# Patient Record
Sex: Female | Born: 1935
Health system: Southern US, Community
[De-identification: ages and names within clinical notes are randomized; demographics above are authoritative.]

## PROBLEM LIST (undated history)

## (undated) DIAGNOSIS — Z803 Family history of malignant neoplasm of breast: Secondary | ICD-10-CM

## (undated) DIAGNOSIS — H9190 Unspecified hearing loss, unspecified ear: Secondary | ICD-10-CM

## (undated) DIAGNOSIS — Z8371 Family history of colonic polyps: Secondary | ICD-10-CM

## (undated) DIAGNOSIS — I10 Essential (primary) hypertension: Secondary | ICD-10-CM

## (undated) DIAGNOSIS — J189 Pneumonia, unspecified organism: Secondary | ICD-10-CM

## (undated) DIAGNOSIS — G039 Meningitis, unspecified: Secondary | ICD-10-CM

## (undated) DIAGNOSIS — I340 Nonrheumatic mitral (valve) insufficiency: Secondary | ICD-10-CM

## (undated) DIAGNOSIS — I071 Rheumatic tricuspid insufficiency: Secondary | ICD-10-CM

## (undated) DIAGNOSIS — R51 Headache: Secondary | ICD-10-CM

## (undated) DIAGNOSIS — Z83719 Family history of colon polyps, unspecified: Secondary | ICD-10-CM

## (undated) DIAGNOSIS — R0600 Dyspnea, unspecified: Secondary | ICD-10-CM

## (undated) DIAGNOSIS — I272 Pulmonary hypertension, unspecified: Secondary | ICD-10-CM

## (undated) DIAGNOSIS — E119 Type 2 diabetes mellitus without complications: Secondary | ICD-10-CM

## (undated) DIAGNOSIS — K219 Gastro-esophageal reflux disease without esophagitis: Secondary | ICD-10-CM

## (undated) DIAGNOSIS — M810 Age-related osteoporosis without current pathological fracture: Secondary | ICD-10-CM

## (undated) DIAGNOSIS — R519 Headache, unspecified: Secondary | ICD-10-CM

## (undated) DIAGNOSIS — Z8041 Family history of malignant neoplasm of ovary: Secondary | ICD-10-CM

## (undated) DIAGNOSIS — E785 Hyperlipidemia, unspecified: Secondary | ICD-10-CM

## (undated) DIAGNOSIS — I371 Nonrheumatic pulmonary valve insufficiency: Secondary | ICD-10-CM

## (undated) HISTORY — PX: ABDOMINAL HYSTERECTOMY: SHX81

## (undated) HISTORY — DX: Type 2 diabetes mellitus without complications: E11.9

## (undated) HISTORY — DX: Rheumatic tricuspid insufficiency: I07.1

## (undated) HISTORY — DX: Family history of malignant neoplasm of breast: Z80.3

## (undated) HISTORY — DX: Family history of colon polyps, unspecified: Z83.719

## (undated) HISTORY — DX: Meningitis, unspecified: G03.9

## (undated) HISTORY — DX: Pulmonary hypertension, unspecified: I27.20

## (undated) HISTORY — DX: Nonrheumatic mitral (valve) insufficiency: I34.0

## (undated) HISTORY — DX: Age-related osteoporosis without current pathological fracture: M81.0

## (undated) HISTORY — DX: Family history of malignant neoplasm of ovary: Z80.41

## (undated) HISTORY — DX: Family history of colonic polyps: Z83.71

## (undated) HISTORY — DX: Nonrheumatic pulmonary valve insufficiency: I37.1

## (undated) HISTORY — DX: Hyperlipidemia, unspecified: E78.5

## (undated) HISTORY — DX: Essential (primary) hypertension: I10

---
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
## 1999-01-05 ENCOUNTER — Encounter: Payer: Self-pay | Admitting: Urology

## 1999-01-17 ENCOUNTER — Observation Stay (HOSPITAL_COMMUNITY): Admission: RE | Admit: 1999-01-17 | Discharge: 1999-01-18 | Payer: Self-pay | Admitting: Urology

## 1999-08-29 ENCOUNTER — Other Ambulatory Visit: Admission: RE | Admit: 1999-08-29 | Discharge: 1999-08-29 | Payer: Self-pay | Admitting: Obstetrics and Gynecology

## 2000-07-23 ENCOUNTER — Other Ambulatory Visit: Admission: RE | Admit: 2000-07-23 | Discharge: 2000-07-23 | Payer: Self-pay | Admitting: Orthopedic Surgery

## 2000-08-29 ENCOUNTER — Other Ambulatory Visit: Admission: RE | Admit: 2000-08-29 | Discharge: 2000-08-29 | Payer: Self-pay | Admitting: Obstetrics and Gynecology

## 2001-04-23 ENCOUNTER — Ambulatory Visit (HOSPITAL_COMMUNITY): Admission: RE | Admit: 2001-04-23 | Discharge: 2001-04-23 | Payer: Self-pay | Admitting: *Deleted

## 2001-09-15 ENCOUNTER — Other Ambulatory Visit: Admission: RE | Admit: 2001-09-15 | Discharge: 2001-09-15 | Payer: Self-pay | Admitting: Obstetrics and Gynecology

## 2002-09-21 ENCOUNTER — Other Ambulatory Visit: Admission: RE | Admit: 2002-09-21 | Discharge: 2002-09-21 | Payer: Self-pay | Admitting: Obstetrics and Gynecology

## 2005-02-22 ENCOUNTER — Ambulatory Visit (HOSPITAL_COMMUNITY): Admission: RE | Admit: 2005-02-22 | Discharge: 2005-02-22 | Payer: Self-pay | Admitting: Orthopedic Surgery

## 2007-09-22 ENCOUNTER — Encounter: Admission: RE | Admit: 2007-09-22 | Discharge: 2007-09-22 | Payer: Self-pay | Admitting: Family Medicine

## 2010-06-23 ENCOUNTER — Other Ambulatory Visit: Payer: Self-pay | Admitting: Family Medicine

## 2010-06-26 ENCOUNTER — Ambulatory Visit
Admission: RE | Admit: 2010-06-26 | Discharge: 2010-06-26 | Disposition: A | Discharge status: Home / Self Care | Payer: Medicare Other | Source: Ambulatory Visit | Attending: Family Medicine | Admitting: Family Medicine

## 2010-06-30 ENCOUNTER — Other Ambulatory Visit: Payer: Self-pay | Admitting: Family Medicine

## 2010-06-30 DIAGNOSIS — N281 Cyst of kidney, acquired: Secondary | ICD-10-CM

## 2010-07-05 ENCOUNTER — Ambulatory Visit
Admission: RE | Admit: 2010-07-05 | Discharge: 2010-07-05 | Disposition: A | Discharge status: Home / Self Care | Payer: Medicare Other | Source: Ambulatory Visit | Attending: Family Medicine | Admitting: Family Medicine

## 2010-07-05 DIAGNOSIS — N281 Cyst of kidney, acquired: Secondary | ICD-10-CM

## 2010-07-05 MED ORDER — IOHEXOL 300 MG/ML  SOLN
100.0000 mL | Freq: Once | INTRAMUSCULAR | Status: AC | PRN
  Administered 2010-07-05: 100 mL via INTRAVENOUS

## 2010-08-25 NOTE — Op Note (Signed)
NAME:  Maria Collier, Maria Collier               ACCOUNT NO.:  [id number]   MEDICAL RECORD NO.:  [id number]          PATIENT TYPE:  AMB   LOCATION:  DAY                          FACILITY:  [facility]   PHYSICIAN:  Marlowe Kays, M.D.  DATE OF BIRTH:  1934/03/31   DATE OF PROCEDURE:  02/22/2005  DATE OF DISCHARGE:                                 OPERATIVE REPORT   PREOPERATIVE DIAGNOSIS:  Torn meniscus, right knee.   POSTOPERATIVE DIAGNOSES:  1.  Torn medial meniscus.  2.  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   OPERATION:  1.  Right knee arthroscopy with partial medial meniscectomy.  2.  Shaving medial femoral condyle and patella.   SURGEON:  Marlowe Kays, M.D.   ASSISTANT:  Nurse.   ANESTHESIA:  General.   PATHOLOGY AND JUSTIFICATION FOR PROCEDURE:  As stated in the diagnosis, MRI  demonstrated the posterior horn tear of the medial meniscus consistent with  her pain pattern.   PROCEDURE:  Satisfactory general anesthesia, pneumatic tourniquet, leg was  esmarched out nonsterilely, thigh stabilizer applied and the leg was prepped  from stabilizer to ankle with DuraPrep, draped in a sterile field. An Ace  wrap and knee support for left lower extremity, superior and medial saline  inflow. First through an anterolateral portal, the medial compartment of the  knee joint was evaluated, the chondromalacia of the medial femoral condyle  and the tear of the medial meniscus going from approximately  the mid third  to the intercondylar area were appreciated. I shaved with a 3.5 shaver and  also gently rasped the medial femoral condyle. The torn medial meniscus I  handled with small baskets and a 3.5 shaver until the remaining rim was  nice, smooth and stable on probing, a final picture was taken. Looking up in  the medial gutter and suprapatellar area, her patella demonstrated the wear  noted above and I shaved it down until smooth with a 3.5 shaver as  well. I  then reversed portals, the lateral compartment of the knee joint looked  relatively unremarkable with some minimal fraying of the lateral meniscus.  The knee joint was then irrigated until clear and all fluid possible  removed. The two anterior portals were closed with 4-0 nylon. 20 mL of 0.5%  Marcaine with adrenaline and 4 mg of morphine were then instilled through  the inflow apparatus which  was removed and this portal closed with 4-0 nylon as well. Betadine adaptic  dry sterile dressing were applied, tourniquet was released. At the time of  this dictation, she was on her way to the recovery room in satisfactory  condition with no known complications.           ______________________________  Marlowe Kays, M.D.     JA/MEDQ  D:  02/22/2005  T:  02/22/2005  Job:  [id number]

## 2010-08-26 ENCOUNTER — Ambulatory Visit: Payer: Medicare Other | Admitting: Family Medicine

## 2011-10-22 ENCOUNTER — Telehealth: Payer: Self-pay | Admitting: *Deleted

## 2011-10-22 NOTE — Telephone Encounter (Signed)
Opened in error

## 2012-05-07 ENCOUNTER — Other Ambulatory Visit: Payer: Self-pay | Admitting: Ophthalmology

## 2012-05-07 DIAGNOSIS — H5340 Unspecified visual field defects: Secondary | ICD-10-CM

## 2012-05-13 ENCOUNTER — Other Ambulatory Visit: Payer: Medicare Other

## 2012-05-19 ENCOUNTER — Other Ambulatory Visit: Payer: Medicare Other

## 2013-08-01 ENCOUNTER — Encounter: Payer: Self-pay | Admitting: *Deleted

## 2013-08-01 DIAGNOSIS — E119 Type 2 diabetes mellitus without complications: Secondary | ICD-10-CM | POA: Insufficient documentation

## 2013-08-01 DIAGNOSIS — I10 Essential (primary) hypertension: Secondary | ICD-10-CM | POA: Insufficient documentation

## 2013-08-01 DIAGNOSIS — E785 Hyperlipidemia, unspecified: Secondary | ICD-10-CM

## 2014-07-20 ENCOUNTER — Encounter: Payer: Self-pay | Admitting: *Deleted

## 2014-07-21 ENCOUNTER — Ambulatory Visit (INDEPENDENT_AMBULATORY_CARE_PROVIDER_SITE_OTHER): Payer: Medicare Other | Admitting: Cardiology

## 2014-07-21 ENCOUNTER — Encounter: Payer: Self-pay | Admitting: Cardiology

## 2014-07-21 VITALS — BP 168/58 | HR 72 | Ht 62.0 in | Wt 134.8 lb

## 2014-07-21 DIAGNOSIS — E119 Type 2 diabetes mellitus without complications: Secondary | ICD-10-CM

## 2014-07-21 DIAGNOSIS — R0600 Dyspnea, unspecified: Secondary | ICD-10-CM | POA: Diagnosis not present

## 2014-07-21 DIAGNOSIS — I10 Essential (primary) hypertension: Secondary | ICD-10-CM | POA: Diagnosis not present

## 2014-07-21 DIAGNOSIS — E785 Hyperlipidemia, unspecified: Secondary | ICD-10-CM | POA: Diagnosis not present

## 2014-07-21 DIAGNOSIS — R0789 Other chest pain: Secondary | ICD-10-CM

## 2014-07-21 NOTE — Progress Notes (Signed)
Cardiology Office Note   Date:  07/21/2014   ID:  Maria Collier, DOB Jun 07, 1933, MRN [id number]  PCP:  Osborne Casco, MD  Cardiologist:   Candee Furbish, MD       History of Present Illness: Maria Collier is a 81 y.o. female who presents for evaluation of shortness of breath has a strong family history of heart disease, has diabetes, hypertension, hyperlipidemia with gradual onset of dyspnea on exertion as well as atypical chest pain. Over the last 2 months she's noted a decrease in her exercise tolerance and feels more short of breath than usual. She feels as though this is limiting her activities. She is trying to exercise regularly but becomes easily fatigued and breathless. Even sometimes housework such as vacuuming will elicit symptoms. She will have occasionally some fullness in her chest when she feels her breathing trouble. No other associated symptoms such as fevers, cough, syncope, palpitations, orthopnea. No smoking.  In 2012 in [place]. Felt similar sx. Checked out there. Worse this time. Some cough. Try to get stuff out, she says. Wants to get to window to look for air. Bend over feels dizzy.   LDL cholesterol 65, HDL 59, triglycerides 96, total cholesterol 143, hemoglobin A1c 6.8, creatinine 0.95, potassium 3.7.  EKG from 06/18/14 demonstrates sinus rhythm with premature atrial complex, nonspecific ST-T wave changes, depression noted in lead 4, 5, 6. When compared to prior EKG from 04/24/10, ST segment depression is new.  I saw her on 05/02/2010 (over 3 years ago) for evaluation of shortness of breath and frequent PVCs. Chest x-ray at that time was done and was normal. She also had a nuclear stress test 04/2010 which was low risk with no ischemia, normal ejection fraction. An echocardiogram was also performed at that time which showed normal ejection fraction, left ventricular hypertrophy, mitral annular calcification, mild mitral regurgitation and diastolic  dysfunction.    Past Medical History  Diagnosis Date  . Hyperlipidemia   . Hypertension   . Diabetes mellitus without complication   . Meningitis   . Osteoporosis     History reviewed. No pertinent past surgical history.   Current Outpatient Prescriptions  Medication Sig Dispense Refill  . alendronate (FOSAMAX) 70 MG tablet Take 70 mg by mouth once a week. Take with a full glass of water on an empty stomach.    Marland Kitchen aspirin 81 MG tablet Take 81 mg by mouth daily.    Marland Kitchen ezetimibe (ZETIA) 10 MG tablet Take 10 mg by mouth daily.    . hydrochlorothiazide (HYDRODIURIL) 25 MG tablet Take 25 mg by mouth daily.    . metFORMIN (GLUCOPHAGE) 500 MG tablet Take by mouth 2 (two) times daily with a meal.    . metoprolol succinate (TOPROL-XL) 100 MG 24 hr tablet Take 100 mg by mouth daily. Take with or immediately following a meal.    . simvastatin (ZOCOR) 40 MG tablet Take 40 mg by mouth daily.     No current facility-administered medications for this visit.    Allergies:   Altace; Hydrocodone; and Hyzaar    Social History:  The patient  reports that she has never smoked. She does not have any smokeless tobacco history on file. She reports that she does not drink alcohol or use illicit drugs.   Family History:  The patient's family history includes father with coronary artery disease, myocardial infarction and death. Also had colon cancer. Mother had COPD, brother had coronary artery disease with myocardial  infarction and died.    ROS:  Please see the history of present illness. Positive for chest pain, waking up at night short of breath, shortness of breath when laying down, cough, dizziness, headaches.  Otherwise, review of systems are positive for none.   All other systems are reviewed and negative.    PHYSICAL EXAM: VS:  BP 168/58 mmHg  Pulse 72  Ht 5\' 2"  (1.575 m)  Wt 134 lb 12.8 oz (61.145 kg)  BMI 24.65 kg/m2 , BMI Body mass index is 24.65 kg/(m^2). GEN: Well nourished, well  developed, in no acute distress HEENT: normal Neck: no JVD, carotid bruits, or masses Cardiac: RRR; no murmurs, rubs, or gallops,no edema  Respiratory:  clear to auscultation bilaterally, normal work of breathing GI: soft, nontender, nondistended, + BS MS: no deformity or atrophy Skin: warm and dry, no rash Neuro:  Strength and sensation are intact Psych: euthymic mood, full affect   EKG:  EKG is ordered today. The ekg ordered today 07/21/14 demonstrates sinus rhythm with nonspecific ST-T wave changes. There is less accentuation of ST segment depression in V4, V5 as was seen on prior EKG as described above in history of present illness.   Recent Labs: No results found for requested labs within last 365 days.    Lipid Panel No results found for: CHOL, TRIG, HDL, CHOLHDL, VLDL, LDLCALC, LDLDIRECT    Wt Readings from Last 3 Encounters:  07/21/14 134 lb 12.8 oz (61.145 kg)      Other studies Reviewed: Additional studies/ records that were reviewed today include: Prior medical records reviewed, lab work, testing. Review of the above records demonstrates: As above   ASSESSMENT AND PLAN:  1.  Dyspnea on exertion-previous evaluation in 2012 was reassuring. She once again is having similar symptoms. Given diabetes, coronary artery disease equivalent as well as family history, hypertension, hyperlipidemia, I would like to proceed with nuclear stress test for further evaluation of possible ischemia. Her shortness of breath/chest fullness could be a symptom of ischemia. I do note that there are ST segment depression on the EKG which was not there on prior EKG., Possible ischemic change. I think it would also be reasonable once again to check an echocardiogram to demonstrate structure and function of her heart.  2. Atypical chest pain-described more as a fullness during her sensation of shortness of breath. Evaluating with nuclear stress test.  3. Diabetes-under good control with hemoglobin  A1c less than 7. This is been quite consistent over the last several years.  4. Family history of CAD-both her brother and father died of myocardial infarction. Continue with aggressive primary prevention.  5. Hyperlipidemia-continue with statin therapy. Excellent LDL.  6. Essential hypertension-blood pressure was mildly elevated today, on repeat was 155/70. She states that at home she takes it 2-3 times a week and is usually in the 120 to 130 range. Very rarely is it 140.   Current medicines are reviewed at length with the patient today.  The patient does not have concerns regarding medicines.  The following changes have been made:  no change  Labs/ tests ordered today include:  No orders of the defined types were placed in this encounter.     Disposition:  We'll follow-up with stress test results as well as echocardiogram results. If both are reassuring we could consider pulmonary evaluation.   Bobby Rumpf, MD  07/21/2014 8:15 AM    [facility] [place], [place], [place]  [address] Phone: [phone])  [phone]; Fax: [phone]    

## 2014-07-21 NOTE — Patient Instructions (Signed)
Your physician recommends that you continue on your current medications as directed. Please refer to the Current Medication list given to you today.  Your physician has requested that you have en exercise stress myoview. For further information please visit [email or web address]. Please follow instruction sheet, as given.  Your physician has requested that you have an echocardiogram. Echocardiography is a painless test that uses sound waves to create images of your heart. It provides your doctor with information about the size and shape of your heart and how well your heart's chambers and valves are working. This procedure takes approximately one hour. There are no restrictions for this procedure.  Your physician recommends that you schedule a follow-up appointment in: as needed

## 2014-08-17 ENCOUNTER — Telehealth (HOSPITAL_COMMUNITY): Payer: Self-pay

## 2014-08-17 ENCOUNTER — Telehealth (HOSPITAL_COMMUNITY): Payer: Self-pay | Admitting: *Deleted

## 2014-08-17 NOTE — Telephone Encounter (Signed)
Left message on voicemail in reference to upcoming appointment scheduled for 08-18-2014. Phone number given for a call back so details instructions can be given. Maria Collier, [name] A

## 2014-08-17 NOTE — Telephone Encounter (Signed)
Patient given detailed instructions per Myocardial Perfusion Study Information Sheet for test on 08/18/14 at 0930. Patient verbalized understanding. Maria Collier, Ranae Palms

## 2014-08-18 ENCOUNTER — Other Ambulatory Visit: Payer: Self-pay

## 2014-08-18 ENCOUNTER — Ambulatory Visit (HOSPITAL_COMMUNITY): Payer: Medicare Other | Attending: Cardiology

## 2014-08-18 ENCOUNTER — Ambulatory Visit (HOSPITAL_BASED_OUTPATIENT_CLINIC_OR_DEPARTMENT_OTHER): Payer: Medicare Other

## 2014-08-18 ENCOUNTER — Other Ambulatory Visit (HOSPITAL_COMMUNITY): Payer: Medicare Other

## 2014-08-18 ENCOUNTER — Encounter (HOSPITAL_COMMUNITY): Payer: Medicare Other

## 2014-08-18 DIAGNOSIS — R0600 Dyspnea, unspecified: Secondary | ICD-10-CM | POA: Diagnosis not present

## 2014-08-18 DIAGNOSIS — R0789 Other chest pain: Secondary | ICD-10-CM

## 2014-08-18 LAB — MYOCARDIAL PERFUSION IMAGING
CHL CUP NUCLEAR SDS: 5
CHL CUP NUCLEAR SRS: 2
CHL CUP NUCLEAR SSS: 7
CHL CUP STRESS STAGE 1 DBP: 68 mmHg
CHL CUP STRESS STAGE 1 GRADE: 0 %
CHL CUP STRESS STAGE 3 HR: 93 {beats}/min
CHL CUP STRESS STAGE 4 GRADE: 0 %
CHL CUP STRESS STAGE 4 SPEED: 0 mph
CHL CUP STRESS STAGE 5 HR: 100 {beats}/min
CHL CUP STRESS STAGE 6 HR: 82 {beats}/min
CHL CUP STRESS STAGE 6 SBP: 152 mmHg
CSEPPMHR: 71 %
Estimated workload: 1 METS
LHR: 0.23
LV sys vol: 20 mL
LVDIAVOL: 60 mL
Nuc Stress EF: 66 %
Peak HR: 101 {beats}/min
Rest HR: 69 {beats}/min
Stage 1 HR: 72 {beats}/min
Stage 1 SBP: 148 mmHg
Stage 1 Speed: 0 mph
Stage 2 Grade: 0 %
Stage 2 HR: 72 {beats}/min
Stage 2 Speed: 0 mph
Stage 3 DBP: 56 mmHg
Stage 3 Grade: 0 %
Stage 3 SBP: 140 mmHg
Stage 3 Speed: 0 mph
Stage 4 HR: 101 {beats}/min
Stage 5 DBP: 61 mmHg
Stage 5 Grade: 0 %
Stage 5 SBP: 155 mmHg
Stage 5 Speed: 0 mph
Stage 6 DBP: 61 mmHg
Stage 6 Grade: 0 %
Stage 6 Speed: 0 mph
TID: 0.91

## 2014-08-18 MED ORDER — TECHNETIUM TC 99M SESTAMIBI GENERIC - CARDIOLITE
33.0000 | Freq: Once | INTRAVENOUS | Status: AC | PRN
  Administered 2014-08-18: 33 via INTRAVENOUS

## 2014-08-18 MED ORDER — REGADENOSON 0.4 MG/5ML IV SOLN
0.4000 mg | Freq: Once | INTRAVENOUS | Status: AC
  Administered 2014-08-18: 0.4 mg via INTRAVENOUS

## 2014-08-18 MED ORDER — TECHNETIUM TC 99M SESTAMIBI GENERIC - CARDIOLITE
11.0000 | Freq: Once | INTRAVENOUS | Status: AC | PRN
  Administered 2014-08-18: 11 via INTRAVENOUS

## 2014-08-18 MED ORDER — AMINOPHYLLINE 25 MG/ML IV SOLN
75.0000 mg | Freq: Once | INTRAVENOUS | Status: AC
  Administered 2014-08-18: 75 mg via INTRAVENOUS

## 2016-06-06 DIAGNOSIS — Z961 Presence of intraocular lens: Secondary | ICD-10-CM | POA: Diagnosis not present

## 2016-06-06 DIAGNOSIS — H524 Presbyopia: Secondary | ICD-10-CM | POA: Diagnosis not present

## 2016-06-06 DIAGNOSIS — E119 Type 2 diabetes mellitus without complications: Secondary | ICD-10-CM | POA: Diagnosis not present

## 2016-06-06 DIAGNOSIS — H40013 Open angle with borderline findings, low risk, bilateral: Secondary | ICD-10-CM | POA: Diagnosis not present

## 2016-09-13 DIAGNOSIS — Z7984 Long term (current) use of oral hypoglycemic drugs: Secondary | ICD-10-CM | POA: Diagnosis not present

## 2016-09-13 DIAGNOSIS — N183 Chronic kidney disease, stage 3 (moderate): Secondary | ICD-10-CM | POA: Diagnosis not present

## 2016-09-13 DIAGNOSIS — E7800 Pure hypercholesterolemia, unspecified: Secondary | ICD-10-CM | POA: Diagnosis not present

## 2016-09-13 DIAGNOSIS — E1121 Type 2 diabetes mellitus with diabetic nephropathy: Secondary | ICD-10-CM | POA: Diagnosis not present

## 2017-01-07 DIAGNOSIS — M1712 Unilateral primary osteoarthritis, left knee: Secondary | ICD-10-CM | POA: Diagnosis not present

## 2017-01-10 DIAGNOSIS — R69 Illness, unspecified: Secondary | ICD-10-CM | POA: Diagnosis not present

## 2017-02-11 DIAGNOSIS — M1712 Unilateral primary osteoarthritis, left knee: Secondary | ICD-10-CM | POA: Diagnosis not present

## 2017-02-25 DIAGNOSIS — M1712 Unilateral primary osteoarthritis, left knee: Secondary | ICD-10-CM | POA: Diagnosis not present

## 2017-03-04 DIAGNOSIS — M1712 Unilateral primary osteoarthritis, left knee: Secondary | ICD-10-CM | POA: Diagnosis not present

## 2017-03-08 DIAGNOSIS — Z1231 Encounter for screening mammogram for malignant neoplasm of breast: Secondary | ICD-10-CM | POA: Diagnosis not present

## 2017-03-11 DIAGNOSIS — M1712 Unilateral primary osteoarthritis, left knee: Secondary | ICD-10-CM | POA: Diagnosis not present

## 2017-03-25 DIAGNOSIS — R921 Mammographic calcification found on diagnostic imaging of breast: Secondary | ICD-10-CM | POA: Diagnosis not present

## 2017-03-26 DIAGNOSIS — E1165 Type 2 diabetes mellitus with hyperglycemia: Secondary | ICD-10-CM | POA: Diagnosis not present

## 2017-03-26 DIAGNOSIS — E7800 Pure hypercholesterolemia, unspecified: Secondary | ICD-10-CM | POA: Diagnosis not present

## 2017-03-26 DIAGNOSIS — E1121 Type 2 diabetes mellitus with diabetic nephropathy: Secondary | ICD-10-CM | POA: Diagnosis not present

## 2017-03-26 DIAGNOSIS — Z7984 Long term (current) use of oral hypoglycemic drugs: Secondary | ICD-10-CM | POA: Diagnosis not present

## 2017-03-27 ENCOUNTER — Other Ambulatory Visit: Payer: Self-pay | Admitting: Radiology

## 2017-03-27 DIAGNOSIS — Z0000 Encounter for general adult medical examination without abnormal findings: Secondary | ICD-10-CM | POA: Diagnosis not present

## 2017-03-27 DIAGNOSIS — D0512 Intraductal carcinoma in situ of left breast: Secondary | ICD-10-CM | POA: Diagnosis not present

## 2017-03-27 DIAGNOSIS — R921 Mammographic calcification found on diagnostic imaging of breast: Secondary | ICD-10-CM | POA: Diagnosis not present

## 2017-03-28 DIAGNOSIS — R69 Illness, unspecified: Secondary | ICD-10-CM | POA: Diagnosis not present

## 2017-03-28 DIAGNOSIS — I129 Hypertensive chronic kidney disease with stage 1 through stage 4 chronic kidney disease, or unspecified chronic kidney disease: Secondary | ICD-10-CM | POA: Diagnosis not present

## 2017-03-28 DIAGNOSIS — N183 Chronic kidney disease, stage 3 (moderate): Secondary | ICD-10-CM | POA: Diagnosis not present

## 2017-03-28 DIAGNOSIS — E1121 Type 2 diabetes mellitus with diabetic nephropathy: Secondary | ICD-10-CM | POA: Diagnosis not present

## 2017-03-28 DIAGNOSIS — I499 Cardiac arrhythmia, unspecified: Secondary | ICD-10-CM | POA: Diagnosis not present

## 2017-03-28 DIAGNOSIS — E7800 Pure hypercholesterolemia, unspecified: Secondary | ICD-10-CM | POA: Diagnosis not present

## 2017-03-29 ENCOUNTER — Telehealth: Payer: Self-pay | Admitting: Hematology and Oncology

## 2017-03-29 NOTE — Telephone Encounter (Signed)
Left message with interpreter for patient to return call if any questions regarding the [facility] appointment for 04/10/17 at 8:15

## 2017-04-04 ENCOUNTER — Encounter: Payer: Self-pay | Admitting: *Deleted

## 2017-04-04 ENCOUNTER — Telehealth: Payer: Self-pay

## 2017-04-04 DIAGNOSIS — D0512 Intraductal carcinoma in situ of left breast: Secondary | ICD-10-CM

## 2017-04-04 NOTE — Telephone Encounter (Signed)
SENT NOTES FOR APPT

## 2017-04-10 ENCOUNTER — Ambulatory Visit (HOSPITAL_BASED_OUTPATIENT_CLINIC_OR_DEPARTMENT_OTHER): Payer: Medicare HMO | Admitting: Hematology and Oncology

## 2017-04-10 ENCOUNTER — Telehealth: Payer: Self-pay | Admitting: Hematology and Oncology

## 2017-04-10 ENCOUNTER — Other Ambulatory Visit: Payer: Self-pay | Admitting: General Surgery

## 2017-04-10 ENCOUNTER — Encounter: Payer: Self-pay | Admitting: *Deleted

## 2017-04-10 ENCOUNTER — Encounter: Payer: Self-pay | Admitting: Hematology and Oncology

## 2017-04-10 ENCOUNTER — Ambulatory Visit
Admission: RE | Admit: 2017-04-10 | Discharge: 2017-04-10 | Disposition: A | Discharge status: Home / Self Care | Payer: Medicare Other | Source: Ambulatory Visit | Attending: Radiation Oncology | Admitting: Radiation Oncology

## 2017-04-10 ENCOUNTER — Encounter: Payer: Self-pay | Admitting: Radiation Oncology

## 2017-04-10 ENCOUNTER — Other Ambulatory Visit (HOSPITAL_BASED_OUTPATIENT_CLINIC_OR_DEPARTMENT_OTHER): Payer: Medicare HMO

## 2017-04-10 ENCOUNTER — Ambulatory Visit: Payer: Medicare HMO | Admitting: Physical Therapy

## 2017-04-10 DIAGNOSIS — D0512 Intraductal carcinoma in situ of left breast: Secondary | ICD-10-CM

## 2017-04-10 DIAGNOSIS — E119 Type 2 diabetes mellitus without complications: Secondary | ICD-10-CM | POA: Diagnosis not present

## 2017-04-10 DIAGNOSIS — Z171 Estrogen receptor negative status [ER-]: Secondary | ICD-10-CM

## 2017-04-10 DIAGNOSIS — C50412 Malignant neoplasm of upper-outer quadrant of left female breast: Secondary | ICD-10-CM | POA: Diagnosis not present

## 2017-04-10 DIAGNOSIS — E785 Hyperlipidemia, unspecified: Secondary | ICD-10-CM | POA: Diagnosis not present

## 2017-04-10 DIAGNOSIS — H913 Deaf nonspeaking, not elsewhere classified: Secondary | ICD-10-CM | POA: Diagnosis not present

## 2017-04-10 DIAGNOSIS — Z8041 Family history of malignant neoplasm of ovary: Secondary | ICD-10-CM | POA: Diagnosis not present

## 2017-04-10 DIAGNOSIS — Z170 Estrogen receptor positive status [ER+]: Principal | ICD-10-CM

## 2017-04-10 DIAGNOSIS — Z809 Family history of malignant neoplasm, unspecified: Secondary | ICD-10-CM

## 2017-04-10 DIAGNOSIS — Z789 Other specified health status: Secondary | ICD-10-CM | POA: Diagnosis not present

## 2017-04-10 DIAGNOSIS — I10 Essential (primary) hypertension: Secondary | ICD-10-CM | POA: Diagnosis not present

## 2017-04-10 HISTORY — DX: Unspecified hearing loss, unspecified ear: H91.90

## 2017-04-10 LAB — COMPREHENSIVE METABOLIC PANEL
ALK PHOS: 26 U/L — AB (ref 40–150)
ALT: 14 U/L (ref 0–55)
ANION GAP: 9 meq/L (ref 3–11)
AST: 14 U/L (ref 5–34)
Albumin: 3.8 g/dL (ref 3.5–5.0)
BUN: 22.1 mg/dL (ref 7.0–26.0)
CO2: 30 meq/L — AB (ref 22–29)
Calcium: 10 mg/dL (ref 8.4–10.4)
Chloride: 100 mEq/L (ref 98–109)
Creatinine: 1 mg/dL (ref 0.6–1.1)
EGFR: 53 mL/min/{1.73_m2} — AB (ref >60)
Glucose: 118 mg/dl (ref 70–140)
POTASSIUM: 4 meq/L (ref 3.5–5.1)
Sodium: 140 mEq/L (ref 136–145)
TOTAL PROTEIN: 7.1 g/dL (ref 6.4–8.3)
Total Bilirubin: 0.42 mg/dL (ref 0.20–1.20)

## 2017-04-10 LAB — CBC WITH DIFFERENTIAL/PLATELET
BASO%: 1.2 % (ref 0.0–2.0)
BASOS ABS: 0.1 10*3/uL (ref 0.0–0.1)
EOS ABS: 0 10*3/uL (ref 0.0–0.5)
EOS%: 0.4 % (ref 0.0–7.0)
HCT: 37.3 % (ref 34.8–46.6)
HGB: 12.2 g/dL (ref 11.6–15.9)
LYMPH%: 22.4 % (ref 14.0–49.7)
MCH: 30.6 pg (ref 25.1–34.0)
MCHC: 32.7 g/dL (ref 31.5–36.0)
MCV: 93.4 fL (ref 79.5–101.0)
MONO#: 0.5 10*3/uL (ref 0.1–0.9)
MONO%: 7.3 % (ref 0.0–14.0)
NEUT#: 4.7 10*3/uL (ref 1.5–6.5)
NEUT%: 68.7 % (ref 38.4–76.8)
PLATELETS: 180 10*3/uL (ref 145–400)
RBC: 3.99 10*6/uL (ref 3.70–5.45)
RDW: 14.7 % — ABNORMAL HIGH (ref 11.2–14.5)
WBC: 6.8 10*3/uL (ref 3.9–10.3)
lymph#: 1.5 10*3/uL (ref 0.9–3.3)

## 2017-04-10 NOTE — Telephone Encounter (Signed)
No 1/2 los at check out

## 2017-04-10 NOTE — Addendum Note (Signed)
Encounter addended by: Hayden Pedro, PA-C on: 04/10/2017 10:35 AM  Actions taken: Sign clinical note

## 2017-04-10 NOTE — Addendum Note (Signed)
Encounter addended by: Gery Pray, MD on: 04/10/2017 11:56 AM  Actions taken: Sign clinical note

## 2017-04-10 NOTE — Progress Notes (Addendum)
Radiation Oncology         [phone] ________________________________  Name: Maria Collier        MRN: [id number]  Date of Service: 04/10/2017 DOB: [birth year]  WI:[id number], Margaretha Sheffield, MD  Fanny Skates, MD     REFERRING PHYSICIAN: Fanny Skates, MD   DIAGNOSIS: The encounter diagnosis was Ductal carcinoma in situ (DCIS) of left breast.   HISTORY OF PRESENT ILLNESS: Maria Collier is a 84 y.o. female seen in the multidisciplinary breast clinic for a new diagnosis of left breast cancer. The patient was noted to have screening detected calcifications, and this was noted in the upper outer quadrant. She had diagnostic imaging that confirmed a 3.3 cm span of calcifications in the left breast and her axilla was negative for adenopathy on 03/27/17. A tomo guided biopsy was performed that day as well revealing a high grade ER/PR negative DCIS. She comes today to discuss recommendations of treatment for her cancer.   PREVIOUS RADIATION THERAPY: No   PAST MEDICAL HISTORY:  Past Medical History:  Diagnosis Date  . Diabetes mellitus without complication   . Hyperlipidemia   . Hypertension   . Meningitis   . Osteoporosis        PAST SURGICAL HISTORY:No past surgical history on file.   FAMILY HISTORY:  Family History  Family history unknown: Yes     SOCIAL HISTORY:  reports that  has never smoked. She does not have any smokeless tobacco history on file. She reports that she does not drink alcohol or use drugs. The patient is married and lives in [place]. She takes care of her ailing husband who has dementia.    ALLERGIES: Altace [ramipril]; Hydrocodone; and Hyzaar [losartan potassium-hctz]   MEDICATIONS:  Current Outpatient Medications  Medication Sig Dispense Refill  . alendronate (FOSAMAX) 70 MG tablet Take 70 mg by mouth once a week. Take with a full glass of water on an empty stomach.    Marland Kitchen aspirin 81 MG tablet Take 81 mg by mouth daily.    Marland Kitchen ezetimibe (ZETIA)  10 MG tablet Take 10 mg by mouth daily.    . hydrochlorothiazide (HYDRODIURIL) 25 MG tablet Take 25 mg by mouth daily.    . metFORMIN (GLUCOPHAGE) 500 MG tablet Take by mouth 2 (two) times daily with a meal.    . metoprolol succinate (TOPROL-XL) 100 MG 24 hr tablet Take 100 mg by mouth daily. Take with or immediately following a meal.    . simvastatin (ZOCOR) 40 MG tablet Take 40 mg by mouth daily.     No current facility-administered medications for this encounter.      REVIEW OF SYSTEMS: On review of systems, the patient communicates with the assistance a sign language interpretor. She is doing well overall. She denies any chest pain, shortness of breath, cough, fevers, chills, night sweats, unintended weight changes. She denies any bowel or bladder disturbances, and denies abdominal pain, nausea or vomiting. She denies any new musculoskeletal or joint aches or pains. A complete review of systems is obtained and is otherwise negative.     PHYSICAL EXAM:  Wt Readings from Last 3 Encounters:  04/10/17 129 lb 11.2 oz (58.8 kg)  08/18/14 132 lb (59.9 kg)  07/21/14 134 lb 12.8 oz (61.1 kg)   Temp Readings from Last 3 Encounters:  04/10/17 (!) 97.5 F (36.4 C) (Oral)   BP Readings from Last 3 Encounters:  04/10/17 (!) 141/42  07/21/14 (!) 168/58   Pulse Readings from Last  3 Encounters:  04/10/17 68  07/21/14 72     In general this is a well appearing caucasian female in no acute distress. She is deaf and sign language is her primary language. She is alert and oriented x4 and appropriate throughout the examination. HEENT reveals that the patient is normocephalic, atraumatic. EOMs are intact. PERRLA. Skin is intact without any evidence of gross lesions. Cardiovascular exam reveals a regular rate and rhythm, no clicks rubs or murmurs are auscultated. Chest is clear to auscultation bilaterally. Lymphatic assessment is performed and does not reveal any adenopathy in the cervical,  supraclavicular, axillary chains. Bilateral breast exam is performed and reveals fullness in the left breast at the site of her prior biopsy. No palpable masses are noted in the right, and no nipple bleeding is noted bilaterally. Abdomen has active bowel sounds in all quadrants and is intact. The abdomen is soft, non tender, non distended. Lower extremities are negative for pretibial pitting edema, deep calf tenderness, cyanosis or clubbing.   ECOG = 0  0 - Asymptomatic (Fully active, able to carry on all predisease activities without restriction)  1 - Symptomatic but completely ambulatory (Restricted in physically strenuous activity but ambulatory and able to carry out work of a light or sedentary nature. For example, light housework, office work)  2 - Symptomatic, <50% in bed during the day (Ambulatory and capable of all self care but unable to carry out any work activities. Up and about more than 50% of waking hours)  3 - Symptomatic, >50% in bed, but not bedbound (Capable of only limited self-care, confined to bed or chair 50% or more of waking hours)  4 - Bedbound (Completely disabled. Cannot carry on any self-care. Totally confined to bed or chair)  5 - Death   Eustace Pen MM, Creech RH, Tormey DC, et al. [phone]). "Toxicity and response criteria of the [facility]". [place] Oncol. 5 (6): 649-55    LABORATORY DATA:  Lab Results  Component Value Date   WBC 6.8 04/10/2017   HGB 12.2 04/10/2017   HCT 37.3 04/10/2017   MCV 93.4 04/10/2017   PLT 180 04/10/2017   No results found for: NA, K, CL, CO2 No results found for: ALT, AST, GGT, ALKPHOS, BILITOT    RADIOGRAPHY: No results found.     IMPRESSION/PLAN: 1. ER/PR positive, High Grade DCIS of the left breast. Dr. Sondra Come discussed the pathology findings and reviews the nature of noninvasive breast disease. The consensus from the breast conference include breast conservation with lumpectomy followed by adjuvant  radiation. Dr. Sondra Come recommends outlines that if radiotherapy was not considered, she would be offered mastectomy by Dr. Dalbert Batman. The patient is interested in keeping her breast and would accept radiotherapy. We discussed the risks, benefits, short, and long term effects of radiotherapy, and the patient is interested in proceeding. Dr. Sondra Come discussed the delivery and logistics of radiotherapy and anticipates a course of 4 weeks with deep inspiration breath hold technique. We will see her back about 2 weeks after surgery to move forward with the simulation and planning process and anticipate starting radiotherapy about 5-6 weeks after surgery.  2. Possible genetic predisposition to malignancy. The patient's personal and family history is considered and she would be a candidate to meet with genetic counseling. She is interested in referral, and we will coordinate this.  3. Deafness. The patient is accompanied by her friend Lelon Frohlich who is a Youth worker. All communication by phone is coordinated for her  interpretor to be able to be to translate via videochat.   The above documentation reflects my direct findings during this shared patient visit.     Carola Rhine, PAC  Please see the note from Shona Simpson, PA-C from today's visit for more details of today's encounter.  I have personally performed a face to face diagnostic evaluation on this patient and devised the above assessment and plan.   Gery Pray, MD

## 2017-04-10 NOTE — Progress Notes (Signed)
Clinical Social Work [place] Psychosocial Distress Screening [place]  Patient completed distress screening protocol and scored a 7 on the Psychosocial Distress Thermometer which indicates moderate distress. Clinical Social Worker met with patient and patients interpreter in [facility] to assess for distress and other psychosocial needs. Patient stated she was feeling overwhelmed but felt "better" after meeting with the treatment team and getting more information on her treatment plan. CSW and patient discussed common feeling and emotions when being diagnosed with cancer, and the importance of support during treatment. CSW informed patient of the support team and support services at [facility].  Patient has her own medical interpreter who she has added to her HIPPA information. CSW provided contact information and encouraged patient to call with any questions or concerns.  ONCBCN DISTRESS SCREENING 04/10/2017  Screening Type Initial Screening  Distress experienced in past week (1-10) 7  Family Problem type Partner  Emotional problem type Nervousness/Anxiety  Spiritual/Religous concerns type Relating to God  Information Concerns Type Lack of info about maintaining fitness  Physical Problem type Sleep/insomnia  Physician notified of physical symptoms Yes     Johnnye Lana, MSW, LCSW, OSW-C Clinical Social Worker [place] [phone]

## 2017-04-10 NOTE — Progress Notes (Signed)
Nutrition Assessment  Reason for Assessment:  Pt seen in Breast Clinic  ASSESSMENT:   84 year old female with new diagnosis of breast cancer.  Past medical history of DM, HTN, HLD.    Met with patient and interpreter Webb Silversmith (sign language). Patient reports good appetite and eating well to help control DM.   Medications:  reviewed  Labs: reviewed  Anthropometrics:   Height: 62 inches Weight: 129 lb 11.2 oz BMI: 23   NUTRITION DIAGNOSIS: Food and nutrition related knowledge deficit related to new diagnosis of breast cancer as evidenced by no prior need for nutrition related information.  INTERVENTION:   Discussed and provided packet of information regarding nutritional tips for breast cancer patients.  Questions answered.  Teachback method used.  Contact information provided and patient knows to contact me with questions/concerns.    MONITORING, EVALUATION, and GOAL: Pt will consume a healthy plant based diet to maintain lean body mass throughout treatment.   Erie Sica B. Zenia Resides, [place], [place] Registered Dietitian [phone] (pager)

## 2017-04-10 NOTE — Progress Notes (Signed)
[place] NOTE  Patient Care Team: Kelton Pillar, MD as PCP - General (Family Medicine)  CHIEF COMPLAINTS/PURPOSE OF CONSULTATION:  Newly diagnosed left breast DCIS  HISTORY OF PRESENTING ILLNESS:  Maria Collier 84 y.o. female is here because of recent diagnosis of left breast DCIS.  Patient is deaf and we used an interpreter for sign language.  She had a routine screening mammogram that detected abnormality in the left breast.  She had calcifications measuring 3.3 cm.  Axilla was negative.  Biopsy revealed high-grade DCIS that was ER PR negative.  She was presented this morning to the multidisciplinary tumor board and she is here today accompanied by the interpreter to discuss adjuvant treatment options.  I reviewed her records extensively and collaborated the history with the patient.  SUMMARY OF ONCOLOGIC HISTORY:   Ductal carcinoma in situ (DCIS) of left breast   03/27/2017 Initial Diagnosis    Screening detected left breast calcifications 3.3 cm span axilla negative biopsy revealed high-grade DCIS with calcifications ER 0%, PR 0%, Tis N0 stage 0       MEDICAL HISTORY:  Past Medical History:  Diagnosis Date  . Deafness 1937   following meningitis  . Diabetes mellitus without complication ([place])   . Hyperlipidemia   . Hypertension   . Meningitis   . Osteoporosis     SURGICAL HISTORY: Past Surgical History:  Procedure Laterality Date  . ABDOMINAL HYSTERECTOMY      SOCIAL HISTORY: Social History   Socioeconomic History  . Marital status: Married    Spouse name: Not on file  . Number of children: Not on file  . Years of education: Not on file  . Highest education level: Not on file  Social Needs  . Financial resource strain: Not on file  . Food insecurity - worry: Not on file  . Food insecurity - inability: Not on file  . Transportation needs - medical: Not on file  . Transportation needs - non-medical: Not on file  Occupational  History  . Not on file  Tobacco Use  . Smoking status: Never Smoker  . Smokeless tobacco: Never Used  Substance and Sexual Activity  . Alcohol use: No  . Drug use: No  . Sexual activity: Not on file  Other Topics Concern  . Not on file  Social History Narrative  . Not on file    FAMILY HISTORY: Family History  Problem Relation Age of Onset  . Ovarian cancer Daughter        36s    ALLERGIES:  is allergic to altace [ramipril]; hydrocodone; and hyzaar [losartan potassium-hctz].  MEDICATIONS:  Current Outpatient Medications  Medication Sig Dispense Refill  . Ascorbic Acid (VITAMIN C) 100 MG tablet Take 100 mg by mouth daily.    Marland Kitchen aspirin 81 MG tablet Take 81 mg by mouth daily.    . hydrochlorothiazide (HYDRODIURIL) 25 MG tablet Take 25 mg by mouth daily.    . metFORMIN (GLUCOPHAGE) 500 MG tablet Take by mouth 2 (two) times daily with a meal.    . metoprolol succinate (TOPROL-XL) 100 MG 24 hr tablet Take 100 mg by mouth daily. Take with or immediately following a meal.    . simvastatin (ZOCOR) 40 MG tablet Take 40 mg by mouth daily.     No current facility-administered medications for this visit.     REVIEW OF SYSTEMS:   Constitutional: Denies fevers, chills or abnormal night sweats Eyes: Denies blurriness of vision, double vision or watery  eyes Ears, nose, mouth, throat, and face: Denies mucositis or sore throat Respiratory: Denies cough, dyspnea or wheezes Cardiovascular: Denies palpitation, chest discomfort or lower extremity swelling Gastrointestinal:  Denies nausea, heartburn or change in bowel habits Skin: Denies abnormal skin rashes Lymphatics: Denies new lymphadenopathy or easy bruising Neurological:Denies numbness, tingling or new weaknesses Behavioral/Psych: Mood is stable, no new changes  Breast:  Denies any palpable lumps or discharge All other systems were reviewed with the patient and are negative.  PHYSICAL EXAMINATION: ECOG PERFORMANCE STATUS: 0 -  Asymptomatic  Vitals:   04/10/17 0900  BP: (!) 141/42  Pulse: 68  Resp: 17  Temp: (!) 97.5 F (36.4 C)  SpO2: 99%   Filed Weights   04/10/17 0900  Weight: 129 lb 11.2 oz (58.8 kg)    GENERAL:alert, no distress and comfortable SKIN: skin color, texture, turgor are normal, no rashes or significant lesions EYES: normal, conjunctiva are pink and non-injected, sclera clear OROPHARYNX:no exudate, no erythema and lips, buccal mucosa, and tongue normal  NECK: supple, thyroid normal size, non-tender, without nodularity LYMPH:  no palpable lymphadenopathy in the cervical, axillary or inguinal LUNGS: clear to auscultation and percussion with normal breathing effort HEART: regular rate & rhythm and no murmurs and no lower extremity edema ABDOMEN:abdomen soft, non-tender and normal bowel sounds Musculoskeletal:no cyanosis of digits and no clubbing  PSYCH: alert & oriented x 3 with fluent speech NEURO: no focal motor/sensory deficits BREAST: No palpable nodules in breast. No palpable axillary or supraclavicular lymphadenopathy (exam performed in the presence of a chaperone)   LABORATORY DATA:  I have reviewed the data as listed Lab Results  Component Value Date   WBC 6.8 04/10/2017   HGB 12.2 04/10/2017   HCT 37.3 04/10/2017   MCV 93.4 04/10/2017   PLT 180 04/10/2017   Lab Results  Component Value Date   NA 140 04/10/2017   K 4.0 04/10/2017   CO2 30 (H) 04/10/2017    RADIOGRAPHIC STUDIES: I have personally reviewed the radiological reports and agreed with the findings in the report.  ASSESSMENT AND PLAN:  Ductal carcinoma in situ (DCIS) of left breast Since the patient is deaf, entry was conducted with the help of a sign language interpreter.  03/28/2017:Screening detected left breast calcifications 3.3 cm span axilla negative biopsy revealed high-grade DCIS with calcifications ER 0%, PR 0%, Tis N0 stage 0  Pathology review: I discussed with the patient the difference  between DCIS and invasive breast cancer. It is considered a precancerous lesion. DCIS is classified as a 0. It is generally detected through mammograms as calcifications. We discussed the significance of grades and its impact on prognosis. We also discussed the importance of ER and PR receptors and their implications to adjuvant treatment options. Prognosis of DCIS dependence on grade, comedo necrosis. It is anticipated that if not treated, 20-30% of DCIS can develop into invasive breast cancer.  Recommendation: 1. Breast conserving surgery 2. Followed by adjuvant radiation therapy 3. Followed by antiestrogen therapy with tamoxifen 5 years  Tamoxifen counseling: We discussed the risks and benefits of tamoxifen. These include but not limited to insomnia, hot flashes, mood changes, vaginal dryness, and weight gain. Although rare, serious side effects including endometrial cancer, risk of blood clots were also discussed. We strongly believe that the benefits far outweigh the risks. Patient understands these risks and consented to starting treatment. Planned treatment duration is 5 years.  Return to clinic after surgery to discuss the final pathology report and come up with  an adjuvant treatment plan.   All questions were answered. The patient knows to call the clinic with any problems, questions or concerns.    Harriette Ohara, MD 04/10/17

## 2017-04-10 NOTE — Assessment & Plan Note (Signed)
Since the patient is deaf, entry was conducted with the help of a sign language interpreter.  03/28/2017:Screening detected left breast calcifications 3.3 cm span axilla negative biopsy revealed high-grade DCIS with calcifications ER 0%, PR 0%, Tis N0 stage 0  Pathology review: I discussed with the patient the difference between DCIS and invasive breast cancer. It is considered a precancerous lesion. DCIS is classified as a 0. It is generally detected through mammograms as calcifications. We discussed the significance of grades and its impact on prognosis. We also discussed the importance of ER and PR receptors and their implications to adjuvant treatment options. Prognosis of DCIS dependence on grade, comedo necrosis. It is anticipated that if not treated, 20-30% of DCIS can develop into invasive breast cancer.  Recommendation: 1. Breast conserving surgery 2. Followed by adjuvant radiation therapy 3. Followed by antiestrogen therapy with tamoxifen 5 years  Tamoxifen counseling: We discussed the risks and benefits of tamoxifen. These include but not limited to insomnia, hot flashes, mood changes, vaginal dryness, and weight gain. Although rare, serious side effects including endometrial cancer, risk of blood clots were also discussed. We strongly believe that the benefits far outweigh the risks. Patient understands these risks and consented to starting treatment. Planned treatment duration is 5 years.  Return to clinic after surgery to discuss the final pathology report and come up with an adjuvant treatment plan.

## 2017-04-17 ENCOUNTER — Telehealth: Payer: Self-pay | Admitting: *Deleted

## 2017-04-17 NOTE — Telephone Encounter (Signed)
Left vm for pt regarding [place] from 04/10/17. Left contact information for questions or needs.

## 2017-04-19 ENCOUNTER — Telehealth: Payer: Self-pay

## 2017-04-19 NOTE — Telephone Encounter (Signed)
Received TC from [facility] letting us know that pt's video communication device is not working properly today and pt was not sure what her next appointment was for on Wednesday.  Lelon Frohlich said to use [phone] to text patient instead.  VIa text, Let pt know her appt is for next "Thursday" 04-25-2017 at 8 am. Called and left message for Lelon Frohlich letting her know that the text was sent to pt.

## 2017-04-25 ENCOUNTER — Other Ambulatory Visit: Payer: Medicare HMO

## 2017-04-26 ENCOUNTER — Other Ambulatory Visit: Payer: Self-pay | Admitting: *Deleted

## 2017-04-26 DIAGNOSIS — D0512 Intraductal carcinoma in situ of left breast: Secondary | ICD-10-CM

## 2017-04-29 DIAGNOSIS — M1712 Unilateral primary osteoarthritis, left knee: Secondary | ICD-10-CM | POA: Diagnosis not present

## 2017-04-30 ENCOUNTER — Encounter: Payer: Self-pay | Admitting: Radiation Oncology

## 2017-05-03 ENCOUNTER — Telehealth: Payer: Self-pay | Admitting: Hematology and Oncology

## 2017-05-03 NOTE — Telephone Encounter (Signed)
Spoke to patients interpreter and patient (Special interpreting line) regarding upcoming February appointments per 1/18 sch message. Mailed calendar of upcoming appointments.

## 2017-05-08 NOTE — Pre-Procedure Instructions (Addendum)
Maria Collier  05/08/2017      [id number] [place], [address] [place] [address] Phone: [phone] Fax: [phone]    Your procedure is scheduled on Wed. Feb. 6  Report to [facility] Admitting at 9:30 A.M.  Call this number if you have problems the morning of surgery:  [phone]   Remember:  Do not eat food or drink liquids after midnight on Tues. Feb.5              Please complete your PRE-SURGERY ENSURE that was given to before you leave your house the morning of surgery.  Please, if able, drink it in one setting. DO NOT SIP.   Take these medicines the morning of surgery with A SIP OF WATER : tylenol if needed, metoprolol succinate (toprol-XL), eye drops,              7 days prior to surgery STOP taking any Aspirin(unless otherwise instructed by your surgeon), Aleve, Naproxen, Ibuprofen, Motrin, Advil, Goody's, BC's, all herbal medications, fish oil, and all vitamins                       How to Manage Your Diabetes Before and After Surgery  Why is it important to control my blood sugar before and after surgery? . Improving blood sugar levels before and after surgery helps healing and can limit problems. . A way of improving blood sugar control is eating a healthy diet by: o  Eating less sugar and carbohydrates o  Increasing activity/exercise o  Talking with your doctor about reaching your blood sugar goals . High blood sugars (greater than 180 mg/dL) can raise your risk of infections and slow your recovery, so you will need to focus on controlling your diabetes during the weeks before surgery. . Make sure that the doctor who takes care of your diabetes knows about your planned surgery including the date and location.  How do I manage my blood sugar before surgery? . Check your blood sugar at least 4 times a day, starting 2 days before surgery, to make sure that the level is not too high or  low. o Check your blood sugar the morning of your surgery when you wake up and every 2 hours until you get to the Short Stay unit. . If your blood sugar is less than 70 mg/dL, you will need to treat for low blood sugar: o Do not take insulin. o Treat a low blood sugar (less than 70 mg/dL) with  cup of clear juice (cranberry or apple), 4 glucose tablets, OR glucose gel. Recheck blood sugar in 15 minutes after treatment (to make sure it is greater than 70 mg/dL). If your blood sugar is not greater than 70 mg/dL on recheck, call [phone] o  for further instructions. . Report your blood sugar to the short stay nurse when you get to Short Stay.  . If you are admitted to the hospital after surgery: o Your blood sugar will be checked by the staff and you will probably be given insulin after surgery (instead of oral diabetes medicines) to make sure you have good blood sugar levels. o The goal for blood sugar control after surgery is 80-180 mg/dL.        WHAT DO I DO ABOUT MY DIABETES MEDICATION?   Marland Kitchen Do not take oral diabetes medicines (pills) the morning of surgery.    Do not wear  jewelry, make-up or nail polish.  Do not wear lotions, powders, or perfumes, or deodorant.  Do not shave 48 hours prior to surgery.  Men may shave face and neck.  Do not bring valuables to the hospital.  [facility] is not responsible for any belongings or valuables.  Contacts, dentures or bridgework may not be worn into surgery.  Leave your suitcase in the car.  After surgery it may be brought to your room.  For patients admitted to the hospital, discharge time will be determined by your treatment team.  Patients discharged the day of surgery will not be allowed to drive home.   Special instructions:  [facility]- Preparing For Surgery  Before surgery, you can play an important role. Because skin is not sterile, your skin needs to be as free of germs as possible. You can reduce the number of germs on  your skin by washing with CHG (chlorahexidine gluconate) Soap before surgery.  CHG is an antiseptic cleaner which kills germs and bonds with the skin to continue killing germs even after washing.  Please do not use if you have an allergy to CHG or antibacterial soaps. If your skin becomes reddened/irritated stop using the CHG.  Do not shave (including legs and underarms) for at least 48 hours prior to first CHG shower. It is OK to shave your face.  Please follow these instructions carefully.   1. Shower the NIGHT BEFORE SURGERY and the MORNING OF SURGERY with CHG.   2. If you chose to wash your hair, wash your hair first as usual with your normal shampoo.  3. After you shampoo, rinse your hair and body thoroughly to remove the shampoo.  4. Use CHG as you would any other liquid soap. You can apply CHG directly to the skin and wash gently with a scrungie or a clean washcloth.   5. Apply the CHG Soap to your body ONLY FROM THE NECK DOWN.  Do not use on open wounds or open sores. Avoid contact with your eyes, ears, mouth and genitals (private parts). Wash Face and genitals (private parts)  with your normal soap.  6. Wash thoroughly, paying special attention to the area where your surgery will be performed.  7. Thoroughly rinse your body with warm water from the neck down.  8. DO NOT shower/wash with your normal soap after using and rinsing off the CHG Soap.  9. Pat yourself dry with a CLEAN TOWEL.  10. Wear CLEAN PAJAMAS to bed the night before surgery, wear comfortable clothes the morning of surgery  11. Place CLEAN SHEETS on your bed the night of your first shower and DO NOT SLEEP WITH PETS.    Day of Surgery: Do not apply any deodorants/lotions. Please wear clean clothes to the hospital/surgery center.      Please read over the following fact sheets that you were given. Coughing and Deep Breathing and Surgical Site Infection Prevention

## 2017-05-08 NOTE — Pre-Procedure Instructions (Signed)
Maria Collier  05/08/2017      [id number] - [place], [place] [address] Phone: [phone] Fax: [phone]    Your procedure is scheduled on Feb 6.  Report to [facility] Admitting at 930 A.M.  Call this number if you have problems the morning of surgery:  [phone]   Remember:  Do not eat food or drink liquids after midnight.  Take these medicines the morning of surgery with A SIP OF WATER Metoprolol succinate (Toprol-XL)  Stop taking aspirin as directed by your Dr.   Stop taking BC's, Goody's, Herbal medications, Fish Oil, Ibuprofen, Advil, motrin, Aleve     How to Manage Your Diabetes Before and After Surgery  Why is it important to control my blood sugar before and after surgery? . Improving blood sugar levels before and after surgery helps healing and can limit problems. . A way of improving blood sugar control is eating a healthy diet by: o  Eating less sugar and carbohydrates o  Increasing activity/exercise o  Talking with your doctor about reaching your blood sugar goals . High blood sugars (greater than 180 mg/dL) can raise your risk of infections and slow your recovery, so you will need to focus on controlling your diabetes during the weeks before surgery. . Make sure that the doctor who takes care of your diabetes knows about your planned surgery including the date and location.  How do I manage my blood sugar before surgery? . Check your blood sugar at least 4 times a day, starting 2 days before surgery, to make sure that the level is not too high or low. o Check your blood sugar the morning of your surgery when you wake up and every 2 hours until you get to the Short Stay unit. . If your blood sugar is less than 70 mg/dL, you will need to treat for low blood sugar: o Do not take insulin. o Treat a low blood sugar (less than 70 mg/dL) with  cup of clear juice (cranberry or apple), 4  glucose tablets, OR glucose gel. Recheck blood sugar in 15 minutes after treatment (to make sure it is greater than 70 mg/dL). If your blood sugar is not greater than 70 mg/dL on recheck, call [phone] o  for further instructions. . Report your blood sugar to the short stay nurse when you get to Short Stay.  . If you are admitted to the hospital after surgery: o Your blood sugar will be checked by the staff and you will probably be given insulin after surgery (instead of oral diabetes medicines) to make sure you have good blood sugar levels. o The goal for blood sugar control after surgery is 80-180 mg/dL.              WHAT DO I DO ABOUT MY DIABETES MEDICATION?   Marland Kitchen Do not take oral diabetes medicines (pills) the morning of surgery. Metformin (Glucophage)     . The day of surgery, do not take other diabetes injectables, including Byetta (exenatide), Bydureon (exenatide ER), Victoza (liraglutide), or Trulicity (dulaglutide).  . If your CBG is greater than 220 mg/dL, you may take  of your sliding scale (correction) dose of insulin.  Other Instructions:          Patient Signature:  Date:   Nurse Signature:  Date:   Reviewed and Endorsed by [facility] Patient Education Committee, August 2015  Do not wear jewelry, make-up  or nail polish.  Do not wear lotions, powders, or perfumes, or deodorant.  Do not shave 48 hours prior to surgery.  Men may shave face and neck.  Do not bring valuables to the hospital.  [facility] is not responsible for any belongings or valuables.  Contacts, dentures or bridgework may not be worn into surgery.  Leave your suitcase in the car.  After surgery it may be brought to your room.  For patients admitted to the hospital, discharge time will be determined by your treatment team.  Patients discharged the day of surgery will not be allowed to drive home.  Special instructions:   - Preparing for Surgery  Before surgery, you can  play an important role.  Because skin is not sterile, your skin needs to be as free of germs as possible.  You can reduce the number of germs on you skin by washing with CHG (chlorahexidine gluconate) soap before surgery.  CHG is an antiseptic cleaner which kills germs and bonds with the skin to continue killing germs even after washing.  Please DO NOT use if you have an allergy to CHG or antibacterial soaps.  If your skin becomes reddened/irritated stop using the CHG and inform your nurse when you arrive at Short Stay.  Do not shave (including legs and underarms) for at least 48 hours prior to the first CHG shower.  You may shave your face.  Please follow these instructions carefully:   1.  Shower with CHG Soap the night before surgery and the morning of Surgery.  2.  If you choose to wash your hair, wash your hair first as usual with your  normal shampoo.  3.  After you shampoo, rinse your hair and body thoroughly to remove the Shampoo.  4.  Use CHG as you would any other liquid soap.  You can apply chg directly to the skin and wash gently with scrungie or a clean washcloth.  5.  Apply the CHG Soap to your body ONLY FROM THE NECK DOWN.   Do not use on open wounds or open sores.  Avoid contact with your eyes,  ears, mouth and genitals (private parts).  Wash genitals (private parts)  with your normal soap.  6.  Wash thoroughly, paying special attention to the area where your surgery  will be performed.  7.  Thoroughly rinse your body with warm water from the neck down.  8.  DO NOT shower/wash with your normal soap after using and rinsing off the CHG Soap.  9.  Pat yourself dry with a clean towel.            10.  Wear clean pajamas.            11.  Place clean sheets on your bed the night of your first shower and do not sleep with pets.  Day of Surgery  Do not apply any lotions/deoderants the morning of surgery.  Please wear clean clothes to the hospital/surgery center.     Please read over  the following fact sheets that you were given. Pain Booklet, Coughing and Deep Breathing and Surgical Site Infection Prevention

## 2017-05-09 ENCOUNTER — Encounter (HOSPITAL_COMMUNITY)
Admission: RE | Admit: 2017-05-09 | Discharge: 2017-05-09 | Disposition: A | Discharge status: Home / Self Care | Payer: Medicare HMO | Source: Ambulatory Visit | Attending: General Surgery | Admitting: General Surgery

## 2017-05-09 ENCOUNTER — Other Ambulatory Visit: Payer: Self-pay

## 2017-05-09 ENCOUNTER — Encounter (HOSPITAL_COMMUNITY): Payer: Self-pay

## 2017-05-09 DIAGNOSIS — Z01812 Encounter for preprocedural laboratory examination: Secondary | ICD-10-CM | POA: Insufficient documentation

## 2017-05-09 HISTORY — DX: Headache: R51

## 2017-05-09 HISTORY — DX: Pneumonia, unspecified organism: J18.9

## 2017-05-09 HISTORY — DX: Gastro-esophageal reflux disease without esophagitis: K21.9

## 2017-05-09 HISTORY — DX: Headache, unspecified: R51.9

## 2017-05-09 HISTORY — DX: Dyspnea, unspecified: R06.00

## 2017-05-09 LAB — BASIC METABOLIC PANEL
Anion gap: 11 (ref 5–15)
BUN: 17 mg/dL (ref 6–20)
CALCIUM: 9.8 mg/dL (ref 8.9–10.3)
CO2: 28 mmol/L (ref 22–32)
CREATININE: 0.82 mg/dL (ref 0.44–1.00)
Chloride: 100 mmol/L — ABNORMAL LOW (ref 101–111)
GLUCOSE: 121 mg/dL — AB (ref 65–99)
Potassium: 4.1 mmol/L (ref 3.5–5.1)
Sodium: 139 mmol/L (ref 135–145)

## 2017-05-09 LAB — CBC
HCT: 39.2 % (ref 36.0–46.0)
Hemoglobin: 12.4 g/dL (ref 12.0–15.0)
MCH: 30.5 pg (ref 26.0–34.0)
MCHC: 31.6 g/dL (ref 30.0–36.0)
MCV: 96.6 fL (ref 78.0–100.0)
Platelets: 196 10*3/uL (ref 150–400)
RBC: 4.06 MIL/uL (ref 3.87–5.11)
RDW: 13.9 % (ref 11.5–15.5)
WBC: 9.1 10*3/uL (ref 4.0–10.5)

## 2017-05-09 LAB — HEMOGLOBIN A1C
HEMOGLOBIN A1C: 6.4 % — AB (ref 4.8–5.6)
MEAN PLASMA GLUCOSE: 136.98 mg/dL

## 2017-05-09 LAB — GLUCOSE, CAPILLARY: GLUCOSE-CAPILLARY: 114 mg/dL — AB (ref 65–99)

## 2017-05-09 NOTE — Progress Notes (Signed)
PCP: Dr. Kelton Pillar Cardiologist: Dr. Candee Furbish  Fasting sugars  [id number]  Family to call Dr. Darrel Hoover office when to stop aspirin.  Pt. Brought her own sign language interpreter and will bring her day of surgery.

## 2017-05-12 NOTE — H&P (Signed)
Maria Collier  Location: [facility] Patient #: [id number] DOB: 01-May-1933 Undefined / Language: Cleophus Molt / Race: White Female       History of Present Illness  The patient is a 84 year old female who presents with breast cancer. This is a very pleasant 84 year old female, referred by Dr. Gabriel Rainwater at [facility] mammography for evaluation and management of ductal carcinoma in situ left breast, upper outer quadrant. She is seen in the [facility] today by Dr. Lindi Adie, Dr. Sondra Come, and me. Maria Collier is her PCP. She communicates by sign language. Her interpreter, Maria Collier was present throughout the encounter. Mrs. Maria Collier phone number is [phone].  She has no prior history of breast problems. Recent screening mammograms and ultrasound show a 3.2 cm area of calcification in the left breast, upper outer quadrant, 1 o'clock position, middle depth. Axilla looks normal. Image guided biopsy shows high grade, receptor negative DCIS. She is not a candidate for the COMET trial and will need a lumpectomy.  Past history is positive for meningitis, hypertension on beta blockers and HCTZ, non-insulin-dependent diabetes mellitus on metformin, hyperlipidemia on simvastatin.  family history reveals one daughter underwent laparotomy for ovarian cancer but is doing well. Negative for breast cancer social history reveals that she is married but her husband has dementia. They live independently. She communicates by sign language. Denies alcohol or tobacco. Has 2 daughters.   We talked about surgical options for therapy. We talked about lumpectomy, mastectomy with or without reconstruction. She is clearly motivated for lumpectomy and I think she is an excellent candidate for that. She will be scheduled for left breast lumpectomy with radioactive seed localization. This will be followed by radiation therapy. I discussed the indications, details, techniques, and numerous risk of  the surgery with her. She is aware of the risk of bleeding, infection, cosmetic deformity, nerve damage with chronic pain or numbness, reoperation for positive margins, and other unforeseen problems. She understands these issues. All of her questions are answered. We will set this up in the near future.  Orders have been entered into the Epic EHR. Posting sheet completed and forwarded to scheduling    Physical Exam  General Mental Status-Alert. General Appearance-Consistent with stated age. Hydration-Well hydrated. Voice-Normal. Note: Very pleasant and cooperative. Very interactive but using the sign language interpreter. Relatively low BMI.   Head and Neck Head-normocephalic, atraumatic with no lesions or palpable masses. Trachea-midline. Thyroid Gland Characteristics - normal size and consistency.  Eye Eyeball - Bilateral-Extraocular movements intact. Sclera/Conjunctiva - Bilateral-No scleral icterus.  Chest and Lung Exam Chest and lung exam reveals -quiet, even and easy respiratory effort with no use of accessory muscles and on auscultation, normal breath sounds, no adventitious sounds and normal vocal resonance. Inspection Chest Wall - Normal. Back - normal.  Breast Note: Breasts are somewhat atrophic, symmetrical. Biopsy site left breast upper outer quadrant. Perhaps slight vague thickening but no dominant mass. No other skin changes. No axillary adenopathy on either side.   Cardiovascular Cardiovascular examination reveals -normal heart sounds, regular rate and rhythm with no murmurs and normal pedal pulses bilaterally.  Abdomen Inspection Inspection of the abdomen reveals - No Hernias. Skin - Scar - no surgical scars. Palpation/Percussion Palpation and Percussion of the abdomen reveal - Soft, Non Tender, No Rebound tenderness, No Rigidity (guarding) and No hepatosplenomegaly. Auscultation Auscultation of the abdomen reveals - Bowel sounds  normal.  Neurologic Neurologic evaluation reveals -alert and oriented x 3 with no impairment of recent or remote  memory. Mental Status-Normal.  Musculoskeletal Normal Exam - Left-Upper Extremity Strength Normal and Lower Extremity Strength Normal. Normal Exam - Right-Upper Extremity Strength Normal and Lower Extremity Strength Normal.  Lymphatic Head & Neck  General Head & Neck Lymphatics: Bilateral - Description - Normal. Axillary  General Axillary Region: Bilateral - Description - Normal. Tenderness - Non Tender. Femoral & Inguinal  Generalized Femoral & Inguinal Lymphatics: Bilateral - Description - Normal. Tenderness - Non Tender.    Assessment & Plan  PRIMARY CANCER OF UPPER OUTER QUADRANT OF LEFT FEMALE BREAST (C50.412) Current Plans Pt Education - [facility] Breast Cancer Information Given - [facility] "Breast Journey" Package You are being scheduled for surgery- Our schedulers will call you. You should hear from our office's scheduling department within 5 working days about the location, date, and time of surgery. We try to make accommodations for patient's preferences in scheduling surgery, but sometimes the OR schedule or the surgeon's schedule prevents [place] from making those accommodations. If you have not heard from our office [phone]) in 5 working days, call the office and ask for your surgeon's nurse. If you have other questions about your diagnosis, plan, or surgery, call the office and ask for your surgeon's nurse.   Your recent imaging studies and biopsy shows a 3.2 cm diameter area of ductal carcinoma in situ, left breast, upper outer quadrant The tumor grade is high-grade. The tumor is negative for estrogen and progesterone receptors  We have discussed options for therapy including lumpectomy, radiation therapy, mastectomy with or without reconstruction You prefer lumpectomy and I think you are an excellent candidate for that  you will be scheduled for  left breast lumpectomy with radioactive seed localization in the near future I have discussed the indications, techniques, and numerous risk with you in detail  you will be able to go home the same day as the surgery but a responsible adult will need to be in the house with you for 24 hours.  Dr. Darrel Hoover office will call you tomorrow to begin to scheduling process.  USES VISUAL FRAME SIGN LANGUAGE INTERPRETER (Z78.9) FAMILY HISTORY OF OVARIAN CANCER (Z80.41) Impression: She states her daughter was operated on for ovarian cancer but is doing well. HYPERTENSION, ESSENTIAL (I10) TYPE 2 DIABETES MELLITUS TREATED WITHOUT INSULIN (E11.9) HYPERLIPIDEMIA, MILD (E78.5) Impression: Simvastatin    Edsel Petrin. Dalbert Batman, M.D., [facility] General and Minimally invasive Surgery Breast and Colorectal Surgery Office:   [phone] Pager:   [phone]

## 2017-05-14 DIAGNOSIS — R69 Illness, unspecified: Secondary | ICD-10-CM | POA: Diagnosis not present

## 2017-05-15 ENCOUNTER — Encounter (HOSPITAL_COMMUNITY)
Admission: RE | Disposition: A | Discharge status: Home / Self Care | Payer: Self-pay | Source: Ambulatory Visit | Attending: General Surgery

## 2017-05-15 ENCOUNTER — Ambulatory Visit (HOSPITAL_COMMUNITY): Payer: Medicare HMO | Admitting: Certified Registered Nurse Anesthetist

## 2017-05-15 ENCOUNTER — Ambulatory Visit (HOSPITAL_COMMUNITY)
Admission: RE | Admit: 2017-05-15 | Discharge: 2017-05-15 | Disposition: A | Discharge status: Home / Self Care | Payer: Medicare HMO | Source: Ambulatory Visit | Attending: General Surgery | Admitting: General Surgery

## 2017-05-15 ENCOUNTER — Encounter (HOSPITAL_COMMUNITY): Payer: Self-pay | Admitting: *Deleted

## 2017-05-15 DIAGNOSIS — E119 Type 2 diabetes mellitus without complications: Secondary | ICD-10-CM | POA: Diagnosis not present

## 2017-05-15 DIAGNOSIS — K219 Gastro-esophageal reflux disease without esophagitis: Secondary | ICD-10-CM | POA: Diagnosis not present

## 2017-05-15 DIAGNOSIS — I10 Essential (primary) hypertension: Secondary | ICD-10-CM | POA: Insufficient documentation

## 2017-05-15 DIAGNOSIS — C50412 Malignant neoplasm of upper-outer quadrant of left female breast: Secondary | ICD-10-CM | POA: Diagnosis not present

## 2017-05-15 DIAGNOSIS — Z888 Allergy status to other drugs, medicaments and biological substances status: Secondary | ICD-10-CM | POA: Insufficient documentation

## 2017-05-15 DIAGNOSIS — E785 Hyperlipidemia, unspecified: Secondary | ICD-10-CM | POA: Diagnosis not present

## 2017-05-15 DIAGNOSIS — Z8041 Family history of malignant neoplasm of ovary: Secondary | ICD-10-CM | POA: Insufficient documentation

## 2017-05-15 DIAGNOSIS — Z7982 Long term (current) use of aspirin: Secondary | ICD-10-CM | POA: Diagnosis not present

## 2017-05-15 DIAGNOSIS — D0512 Intraductal carcinoma in situ of left breast: Secondary | ICD-10-CM | POA: Diagnosis not present

## 2017-05-15 DIAGNOSIS — N641 Fat necrosis of breast: Secondary | ICD-10-CM | POA: Diagnosis not present

## 2017-05-15 DIAGNOSIS — D0592 Unspecified type of carcinoma in situ of left breast: Secondary | ICD-10-CM | POA: Diagnosis not present

## 2017-05-15 DIAGNOSIS — Z79899 Other long term (current) drug therapy: Secondary | ICD-10-CM | POA: Insufficient documentation

## 2017-05-15 DIAGNOSIS — Z7984 Long term (current) use of oral hypoglycemic drugs: Secondary | ICD-10-CM | POA: Insufficient documentation

## 2017-05-15 DIAGNOSIS — Z885 Allergy status to narcotic agent status: Secondary | ICD-10-CM | POA: Diagnosis not present

## 2017-05-15 DIAGNOSIS — Z8661 Personal history of infections of the central nervous system: Secondary | ICD-10-CM | POA: Diagnosis not present

## 2017-05-15 HISTORY — PX: BREAST LUMPECTOMY WITH RADIOACTIVE SEED LOCALIZATION: SHX6424

## 2017-05-15 LAB — GLUCOSE, CAPILLARY
GLUCOSE-CAPILLARY: 102 mg/dL — AB (ref 65–99)
Glucose-Capillary: 149 mg/dL — ABNORMAL HIGH (ref 65–99)

## 2017-05-15 SURGERY — BREAST LUMPECTOMY WITH RADIOACTIVE SEED LOCALIZATION
Anesthesia: General | Site: Breast | Laterality: Left

## 2017-05-15 MED ORDER — HYDROMORPHONE HCL 1 MG/ML IJ SOLN: 0.2500 mg | INTRAMUSCULAR | Status: DC | PRN

## 2017-05-15 MED ORDER — OXYCODONE HCL 5 MG/5ML PO SOLN: 5.0000 mg | Freq: Once | ORAL | Status: DC | PRN

## 2017-05-15 MED ORDER — PHENYLEPHRINE 40 MCG/ML (10ML) SYRINGE FOR IV PUSH (FOR BLOOD PRESSURE SUPPORT)
PREFILLED_SYRINGE | INTRAVENOUS | Status: AC
  Filled 2017-05-15: qty 10

## 2017-05-15 MED ORDER — CEFAZOLIN SODIUM-DEXTROSE 2-4 GM/100ML-% IV SOLN
2.0000 g | INTRAVENOUS | Status: AC
  Administered 2017-05-15: 2 g via INTRAVENOUS
  Filled 2017-05-15: qty 100

## 2017-05-15 MED ORDER — FENTANYL CITRATE (PF) 250 MCG/5ML IJ SOLN
INTRAMUSCULAR | Status: AC
  Filled 2017-05-15: qty 5

## 2017-05-15 MED ORDER — LIDOCAINE 2% (20 MG/ML) 5 ML SYRINGE
INTRAMUSCULAR | Status: DC | PRN
  Administered 2017-05-15: 60 mg via INTRAVENOUS

## 2017-05-15 MED ORDER — CHLORHEXIDINE GLUCONATE CLOTH 2 % EX PADS: 6.0000 | MEDICATED_PAD | Freq: Once | CUTANEOUS | Status: DC

## 2017-05-15 MED ORDER — BUPIVACAINE-EPINEPHRINE (PF) 0.5% -1:200000 IJ SOLN
INTRAMUSCULAR | Status: DC | PRN
  Administered 2017-05-15: 10 mL

## 2017-05-15 MED ORDER — FENTANYL CITRATE (PF) 100 MCG/2ML IJ SOLN
INTRAMUSCULAR | Status: DC | PRN
  Administered 2017-05-15: 25 ug via INTRAVENOUS

## 2017-05-15 MED ORDER — OXYCODONE HCL 5 MG PO TABS: 5.0000 mg | ORAL_TABLET | Freq: Once | ORAL | Status: DC | PRN

## 2017-05-15 MED ORDER — MEPERIDINE HCL 50 MG/ML IJ SOLN: 6.2500 mg | INTRAMUSCULAR | Status: DC | PRN

## 2017-05-15 MED ORDER — PROPOFOL 10 MG/ML IV BOLUS
INTRAVENOUS | Status: AC
  Filled 2017-05-15: qty 20

## 2017-05-15 MED ORDER — LACTATED RINGERS IV SOLN
INTRAVENOUS | Status: DC
  Administered 2017-05-15 (×2): via INTRAVENOUS

## 2017-05-15 MED ORDER — ONDANSETRON HCL 4 MG/2ML IJ SOLN
INTRAMUSCULAR | Status: AC
  Filled 2017-05-15: qty 2

## 2017-05-15 MED ORDER — ACETAMINOPHEN 500 MG PO TABS
1000.0000 mg | ORAL_TABLET | ORAL | Status: AC
  Administered 2017-05-15: 1000 mg via ORAL
  Filled 2017-05-15: qty 2

## 2017-05-15 MED ORDER — 0.9 % SODIUM CHLORIDE (POUR BTL) OPTIME
TOPICAL | Status: DC | PRN
  Administered 2017-05-15: 1000 mL

## 2017-05-15 MED ORDER — ONDANSETRON HCL 4 MG/2ML IJ SOLN
INTRAMUSCULAR | Status: DC | PRN
  Administered 2017-05-15: 4 mg via INTRAVENOUS

## 2017-05-15 MED ORDER — DEXAMETHASONE SODIUM PHOSPHATE 10 MG/ML IJ SOLN
INTRAMUSCULAR | Status: AC
Start: 2017-05-15 — End: 2017-05-15
  Filled 2017-05-15: qty 1

## 2017-05-15 MED ORDER — BUPIVACAINE-EPINEPHRINE (PF) 0.5% -1:200000 IJ SOLN
INTRAMUSCULAR | Status: AC
  Filled 2017-05-15: qty 30

## 2017-05-15 MED ORDER — PROMETHAZINE HCL 25 MG/ML IJ SOLN: 6.2500 mg | INTRAMUSCULAR | Status: DC | PRN

## 2017-05-15 MED ORDER — PROPOFOL 10 MG/ML IV BOLUS
INTRAVENOUS | Status: DC | PRN
  Administered 2017-05-15: 150 mg via INTRAVENOUS

## 2017-05-15 MED ORDER — PHENYLEPHRINE 40 MCG/ML (10ML) SYRINGE FOR IV PUSH (FOR BLOOD PRESSURE SUPPORT)
PREFILLED_SYRINGE | INTRAVENOUS | Status: DC | PRN
  Administered 2017-05-15 (×2): 120 ug via INTRAVENOUS
  Administered 2017-05-15 (×2): 80 ug via INTRAVENOUS

## 2017-05-15 MED ORDER — DEXAMETHASONE SODIUM PHOSPHATE 10 MG/ML IJ SOLN
INTRAMUSCULAR | Status: DC | PRN
  Administered 2017-05-15: 5 mg via INTRAVENOUS

## 2017-05-15 MED ORDER — GABAPENTIN 300 MG PO CAPS
300.0000 mg | ORAL_CAPSULE | ORAL | Status: AC
  Administered 2017-05-15: 300 mg via ORAL
  Filled 2017-05-15: qty 1

## 2017-05-15 MED ORDER — LIDOCAINE 2% (20 MG/ML) 5 ML SYRINGE
INTRAMUSCULAR | Status: AC
  Filled 2017-05-15: qty 5

## 2017-05-15 MED ORDER — TRAMADOL HCL 50 MG PO TABS: 50.0000 mg | ORAL_TABLET | Freq: Four times a day (QID) | ORAL | 0 refills | Status: DC | PRN

## 2017-05-15 SURGICAL SUPPLY — 47 items
ADH SKN CLS APL DERMABOND .7 (GAUZE/BANDAGES/DRESSINGS) ×1
APPLIER CLIP 9.375 MED OPEN (MISCELLANEOUS) ×2
APR CLP MED 9.3 20 MLT OPN (MISCELLANEOUS) ×1
BINDER BREAST LRG (GAUZE/BANDAGES/DRESSINGS) ×1 IMPLANT
BINDER BREAST XLRG (GAUZE/BANDAGES/DRESSINGS) IMPLANT
BLADE SURG 15 STRL LF DISP TIS (BLADE) ×1 IMPLANT
BLADE SURG 15 STRL SS (BLADE) ×2
CANISTER SUCT 3000ML PPV (MISCELLANEOUS) ×2 IMPLANT
CHLORAPREP W/TINT 26ML (MISCELLANEOUS) ×2 IMPLANT
CLIP APPLIE 9.375 MED OPEN (MISCELLANEOUS) ×1 IMPLANT
COVER PROBE W GEL 5X96 (DRAPES) ×2 IMPLANT
COVER SURGICAL LIGHT HANDLE (MISCELLANEOUS) ×2 IMPLANT
DERMABOND ADVANCED (GAUZE/BANDAGES/DRESSINGS) ×1
DERMABOND ADVANCED .7 DNX12 (GAUZE/BANDAGES/DRESSINGS) ×1 IMPLANT
DEVICE DUBIN SPECIMEN MAMMOGRA (MISCELLANEOUS) ×2 IMPLANT
DRAPE CHEST BREAST 15X10 FENES (DRAPES) ×2 IMPLANT
DRAPE UTILITY XL STRL (DRAPES) ×2 IMPLANT
DRSG PAD ABDOMINAL 8X10 ST (GAUZE/BANDAGES/DRESSINGS) ×2 IMPLANT
ELECT CAUTERY BLADE 6.4 (BLADE) ×2 IMPLANT
ELECT REM PT RETURN 9FT ADLT (ELECTROSURGICAL) ×2
ELECTRODE REM PT RTRN 9FT ADLT (ELECTROSURGICAL) ×1 IMPLANT
GAUZE SPONGE 4X4 12PLY STRL LF (GAUZE/BANDAGES/DRESSINGS) ×2 IMPLANT
GLOVE EUDERMIC 7 POWDERFREE (GLOVE) ×4 IMPLANT
GOWN STRL REUS W/ TWL LRG LVL3 (GOWN DISPOSABLE) ×1 IMPLANT
GOWN STRL REUS W/ TWL XL LVL3 (GOWN DISPOSABLE) ×1 IMPLANT
GOWN STRL REUS W/TWL LRG LVL3 (GOWN DISPOSABLE) ×2
GOWN STRL REUS W/TWL XL LVL3 (GOWN DISPOSABLE) ×2
ILLUMINATOR WAVEGUIDE N/F (MISCELLANEOUS) IMPLANT
KIT BASIN OR (CUSTOM PROCEDURE TRAY) ×2 IMPLANT
KIT MARKER MARGIN INK (KITS) ×2 IMPLANT
LIGHT WAVEGUIDE WIDE FLAT (MISCELLANEOUS) IMPLANT
NDL HYPO 25GX1X1/2 BEV (NEEDLE) ×1 IMPLANT
NEEDLE HYPO 25GX1X1/2 BEV (NEEDLE) ×2 IMPLANT
NS IRRIG 1000ML POUR BTL (IV SOLUTION) ×2 IMPLANT
PACK SURGICAL SETUP 50X90 (CUSTOM PROCEDURE TRAY) ×2 IMPLANT
PAD ABD 8X10 STRL (GAUZE/BANDAGES/DRESSINGS) ×1 IMPLANT
PENCIL BUTTON HOLSTER BLD 10FT (ELECTRODE) ×2 IMPLANT
SPONGE LAP 4X18 X RAY DECT (DISPOSABLE) ×2 IMPLANT
SUT MNCRL AB 4-0 PS2 18 (SUTURE) ×2 IMPLANT
SUT SILK 2 0 SH (SUTURE) ×2 IMPLANT
SUT VIC AB 3-0 SH 18 (SUTURE) ×2 IMPLANT
SYR BULB 3OZ (MISCELLANEOUS) ×2 IMPLANT
SYR CONTROL 10ML LL (SYRINGE) ×2 IMPLANT
TOWEL OR 17X24 6PK STRL BLUE (TOWEL DISPOSABLE) ×2 IMPLANT
TOWEL OR 17X26 10 PK STRL BLUE (TOWEL DISPOSABLE) ×2 IMPLANT
TUBE CONNECTING 12X1/4 (SUCTIONS) ×2 IMPLANT
YANKAUER SUCT BULB TIP NO VENT (SUCTIONS) ×2 IMPLANT

## 2017-05-15 NOTE — Discharge Instructions (Signed)
[facility] °Office Phone Number [phone] ° °BREAST BIOPSY/ PARTIAL MASTECTOMY: POST OP INSTRUCTIONS ° °Always review your discharge instruction sheet given to you by the facility where your surgery was performed. ° °IF YOU HAVE DISABILITY OR FAMILY LEAVE FORMS, YOU MUST BRING THEM TO THE OFFICE FOR PROCESSING.  DO NOT GIVE THEM TO YOUR DOCTOR. ° °1. A prescription for pain medication may be given to you upon discharge.  Take your pain medication as prescribed, if needed.  If narcotic pain medicine is not needed, then you may take acetaminophen (Tylenol) or ibuprofen (Advil) as needed. °2. Take your usually prescribed medications unless otherwise directed °3. If you need a refill on your pain medication, please contact your pharmacy.  They will contact our office to request authorization.  Prescriptions will not be filled after 5pm or on week-ends. °4. You should eat very light the first 24 hours after surgery, such as soup, crackers, pudding, etc.  Resume your normal diet the day after surgery. °5. Most patients will experience some swelling and bruising in the breast.  Ice packs and a good support bra will help.  Swelling and bruising can take several days to resolve.  °6. It is common to experience some constipation if taking pain medication after surgery.  Increasing fluid intake and taking a stool softener will usually help or prevent this problem from occurring.  A mild laxative (Milk of Magnesia or Miralax) should be taken according to package directions if there are no bowel movements after 48 hours. °7. Unless discharge instructions indicate otherwise, you may remove your bandages 24-48 hours after surgery, and you may shower at that time.  You may have steri-strips (small skin tapes) in place directly over the incision.  These strips should be left on the skin for 7-10 days.  If your surgeon used skin glue on the incision, you may shower in 24 hours.  The glue will flake off over the  next 2-3 weeks.  Any sutures or staples will be removed at the office during your follow-up visit. °8. ACTIVITIES:  You may resume regular daily activities (gradually increasing) beginning the next day.  Wearing a good support bra or sports bra minimizes pain and swelling.  You may have sexual intercourse when it is comfortable. °a. You may drive when you no longer are taking prescription pain medication, you can comfortably wear a seatbelt, and you can safely maneuver your car and apply brakes. °b. RETURN TO WORK:  ______________________________________________________________________________________ °9. You should see your doctor in the office for a follow-up appointment approximately two weeks after your surgery.  Your doctor’s nurse will typically make your follow-up appointment when she calls you with your pathology report.  Expect your pathology report 2-3 business days after your surgery.  You may call to check if you do not hear from us after three days. °10. OTHER INSTRUCTIONS: _______________________________________________________________________________________________ _____________________________________________________________________________________________________________________________________ °_____________________________________________________________________________________________________________________________________ °_____________________________________________________________________________________________________________________________________ ° °WHEN TO CALL YOUR DOCTOR: °1. Fever over 101.0 °2. Nausea and/or vomiting. °3. Extreme swelling or bruising. °4. Continued bleeding from incision. °5. Increased pain, redness, or drainage from the incision. ° °The clinic staff is available to answer your questions during regular business hours.  Please don’t hesitate to call and ask to speak to one of the nurses for clinical concerns.  If you have a medical emergency, go to the nearest  emergency room or call 911.  A surgeon from [facility] is always on call at the hospital. ° °For further questions, please visit [email or web address]  °

## 2017-05-15 NOTE — Interval H&P Note (Signed)
History and Physical Interval Note:  05/15/2017 9:46 AM  Maria Collier  has presented today for surgery, with the diagnosis of LEFT BREAST CANCER UPPER OUTER QUADRANT  The various methods of treatment have been discussed with the patient and family. After consideration of risks, benefits and other options for treatment, the patient has consented to  Procedure(s): BREAST LUMPECTOMY WITH RADIOACTIVE SEED LOCALIZATION (Left) as a surgical intervention .  The patient's history has been reviewed, patient examined, no change in status, stable for surgery.  I have reviewed the patient's chart and labs.  Questions were answered to the patient's satisfaction.     [name]

## 2017-05-15 NOTE — Anesthesia Procedure Notes (Signed)
Procedure Name: LMA Insertion Date/Time: 05/15/2017 11:05 AM Performed by: Harden Mo, CRNA Pre-anesthesia Checklist: Patient identified, Emergency Drugs available, Suction available and Patient being monitored Patient Re-evaluated:Patient Re-evaluated prior to induction Oxygen Delivery Method: Circle System Utilized Preoxygenation: Pre-oxygenation with 100% oxygen Induction Type: IV induction Ventilation: Mask ventilation without difficulty LMA: LMA inserted LMA Size: 4.0 Number of attempts: 1 Airway Equipment and Method: Bite block Placement Confirmation: positive ETCO2 Tube secured with: Tape Dental Injury: Teeth and Oropharynx as per pre-operative assessment

## 2017-05-15 NOTE — Anesthesia Postprocedure Evaluation (Signed)
Anesthesia Post Note  Patient: Maria Collier  Procedure(s) Performed: BREAST LUMPECTOMY WITH RADIOACTIVE SEED LOCALIZATION (Left Breast)     Patient location during evaluation: PACU Anesthesia Type: General Level of consciousness: awake and alert Pain management: pain level controlled Vital Signs Assessment: post-procedure vital signs reviewed and stable Respiratory status: spontaneous breathing, nonlabored ventilation and respiratory function stable Cardiovascular status: blood pressure returned to baseline and stable Postop Assessment: no apparent nausea or vomiting Anesthetic complications: no    Last Vitals:  Vitals:   05/15/17 1240 05/15/17 1248  BP: (!) 116/53 (!) 110/51  Pulse: 66 67  Resp: (!) 21 18  Temp:  (!) 36.4 C  SpO2: 95% 100%    Last Pain:  Vitals:   05/15/17 1154  TempSrc:   PainSc: 0-No pain                 Lynda Rainwater

## 2017-05-15 NOTE — Transfer of Care (Signed)
Immediate Anesthesia Transfer of Care Note  Patient: Maria Collier  Procedure(s) Performed: BREAST LUMPECTOMY WITH RADIOACTIVE SEED LOCALIZATION (Left Breast)  Patient Location: PACU  Anesthesia Type:General  Level of Consciousness: awake, alert  and oriented  Airway & Oxygen Therapy: Patient Spontanous Breathing  Post-op Assessment: Report given to RN, Post -op Vital signs reviewed and stable and Patient moving all extremities X 4  Post vital signs: Reviewed and stable  Last Vitals:  Vitals:   05/15/17 0947 05/15/17 0948  BP:  (!) 135/44  Pulse: 71   Resp: 18   Temp: 36.6 C   SpO2: 100%     Last Pain:  Vitals:   05/15/17 0947  TempSrc: Oral         Complications: No apparent anesthesia complications

## 2017-05-15 NOTE — Op Note (Signed)
Patient Name:           Maria Collier   Date of Surgery:        05/15/2017  Pre op Diagnosis:      Ductal carcinoma in situ left breast, upper outer quadrant, high-grade, receptor negative  Post op Diagnosis:    Same  Procedure:                 Left breast lumpectomy with radioactive seed localization  Surgeon:                     Edsel Petrin. Dalbert Batman, M.D., FACS  Assistant:                      OR staff  Operative Indications:  This is a very pleasant 84 year old female, referred by Dr. Gabriel Rainwater at [facility] mammography for evaluation and management of ductal carcinoma in situ left breast, upper outer quadrant. She was seen in the [facility]  by Dr. Lindi Adie, Dr. Sondra Come, and me. Kelton Pillar is her PCP. She communicates by sign language. Her interpreter, Maryjane Hurter is her sign language interpreter, and Mrs. Johnson's phone number is [phone].       She has no prior history of breast problems. Recent screening mammograms and ultrasound show a 3.2 cm area of calcification in the left breast, upper outer quadrant, 1 o'clock position, middle depth. Axilla looks normal. Image guided biopsy shows high grade, receptor negative DCIS. She is not a candidate for the COMET trial and will need a lumpectomy..      We talked about surgical options for therapy. We talked about lumpectomy, mastectomy with or without reconstruction. She is clearly motivated for lumpectomy and I think she is an excellent candidate for that. She will be scheduled for left breast lumpectomy with radioactive seed localization.   Operative Findings:       The radioactive seed and the original biopsy marker clip were in the upper outer quadrant of the left breast, relatively close to each other.  Because the area of calcifications pain 3.2 cm, I took a reasonably generous lumpectomy specimen.  The specimen mammogram looked very good with the marker clip and the seed in the center of the specimen.  Procedure in Detail:           Following the induction of general LMA anesthesia the patient's left breast was prepped and draped in a sterile fashion.  Intravenous antibiotics were given.  Surgical timeout was performed.  0.5% Marcaine with epinephrine was used as local infiltration anesthetic.       Using the neoprobe I designed a curvilinear incision high in the upper outer quadrant of the left breast.  The incision was made with the knife and the lumpectomy was performed with the neoprobe and cautery.  The specimen was removed and marked with silk sutures and a 6 color ink kit to orient the pathologist.  The specimen mammogram looked very good as described above.  The specimen was marked and sent to the lab where the seed was retrieved.  Hemostasis was excellent.  The wound was irrigated.  5 metal clips were placed in the walls of the lumpectomy cavity.  The breast tissues were closed in layers with interrupted sutures of 3-0 Vicryl and the skin closed with a running subcuticular suture of 4-0 Monocryl and Dermabond.  Dry bandages and a breast binder were placed.  The patient tolerated the procedure well was taken to  PACU in stable condition.  EBL 10-20 mL.  Counts correct.  Complications none.    Addendum: I logged onto the [employer] and reviewed her prescription medication history     Deaire Mcwhirter M. Dalbert Batman, M.D., FACS General and Minimally Invasive Surgery Breast and Colorectal Surgery  05/15/2017 11:45 AM

## 2017-05-15 NOTE — Anesthesia Preprocedure Evaluation (Signed)
Anesthesia Evaluation  Patient identified by MRN, date of birth, ID band Patient awake    Reviewed: Allergy & Precautions, NPO status , Patient's Chart, lab work & pertinent test results, reviewed documented beta blocker date and time   Airway Mallampati: II  TM Distance: >3 FB Neck ROM: Full    Dental no notable dental hx.    Pulmonary neg pulmonary ROS,    Pulmonary exam normal breath sounds clear to auscultation       Cardiovascular hypertension, Pt. on medications and Pt. on home beta blockers negative cardio ROS Normal cardiovascular exam Rhythm:Regular Rate:Normal     Neuro/Psych negative neurological ROS  negative psych ROS   GI/Hepatic negative GI ROS, Neg liver ROS, GERD  ,  Endo/Other  negative endocrine ROSdiabetes, Type 2, Oral Hypoglycemic Agents  Renal/GU negative Renal ROS  negative genitourinary   Musculoskeletal negative musculoskeletal ROS (+)   Abdominal   Peds negative pediatric ROS (+)  Hematology negative hematology ROS (+)   Anesthesia Other Findings   Reproductive/Obstetrics negative OB ROS                             Anesthesia Physical Anesthesia Plan  ASA: III  Anesthesia Plan: General   Post-op Pain Management:    Induction: Intravenous  PONV Risk Score and Plan: 3 and Ondansetron, Dexamethasone and Midazolam  Airway Management Planned: LMA  Additional Equipment:   Intra-op Plan:   Post-operative Plan: Extubation in OR  Informed Consent: I have reviewed the patients History and Physical, chart, labs and discussed the procedure including the risks, benefits and alternatives for the proposed anesthesia with the patient or authorized representative who has indicated his/her understanding and acceptance.   Dental advisory given  Plan Discussed with: CRNA  Anesthesia Plan Comments:         Anesthesia Quick Evaluation

## 2017-05-16 ENCOUNTER — Encounter (HOSPITAL_COMMUNITY): Payer: Self-pay | Admitting: General Surgery

## 2017-05-16 NOTE — Progress Notes (Signed)
Inform patient of Pathology report,. Breast pathology shows in situ cancer. No invasive cancer. Margins widely negative. She will not need any further surgery. All good news.  Remember, she is deaf and uses sign language interpreter who is usually available and helpful.  Let me know you reached her.   hmi

## 2017-05-23 NOTE — Progress Notes (Signed)
Location of Breast Cancer: Ductal carcinoma in situ (DCIS) of left breast  Histology per Pathology Report:   05/15/16 Diagnosis Breast, lumpectomy, Left - HIGH GRADE DUCTAL CARCINOMA IN SITU WITH CALCIFICATIONS AND NECROSIS. - NO INVASIVE CARCINOMA IDENTIFIED. - SEE CANCER TABLE.  03/27/17 Diagnosis Breast, left, needle core biopsy - HIGH GRADE DUCTAL CARCINOMA IN SITU WITH CALCIFICATIONS AND NECROSIS  Receptor Status: ER(0%), PR (0%), Her2-neu (), Ki-()  Did patient present with symptoms (if so, please note symptoms) or was this found on screening mammography?: screening mammogram  Past/Anticipated interventions by surgeon, if any: 05/15/17 - Procedure: BREAST LUMPECTOMY WITH RADIOACTIVE SEED LOCALIZATION;  Surgeon: Fanny Skates, MD  Past/Anticipated interventions by medical oncology, if any: no  Lymphedema issues, if any:  no    Pain issues, if any:  no   SAFETY ISSUES:  Prior radiation? no  Pacemaker/ICD? no  Possible current pregnancy?no  Is the patient on methotrexate? no  Current Complaints / other details:  Patient is here with Lelon Frohlich sign language interpreter.  BP (!) 155/56 (BP Location: Right Arm, Patient Position: Sitting)   Pulse 68   Temp 98.2 F (36.8 C) (Oral)   Ht _0  (1.575 m)   Wt 129 lb 6.4 oz (58.7 kg)   SpO2 99%   BMI 23.67 kg/m    Wt Readings from Last 3 Encounters:  05/30/17 129 lb 6.4 oz (58.7 kg)  05/15/17 130 lb (59 kg)  05/09/17 130 lb 6.4 oz (59.1 kg)      Jacqulyn Liner, RN 05/23/2017,8:32 AM

## 2017-05-27 ENCOUNTER — Ambulatory Visit: Payer: Medicare HMO | Admitting: Hematology and Oncology

## 2017-05-30 ENCOUNTER — Other Ambulatory Visit: Payer: Self-pay

## 2017-05-30 ENCOUNTER — Ambulatory Visit: Payer: Medicare HMO | Admitting: Hematology and Oncology

## 2017-05-30 ENCOUNTER — Encounter: Payer: Self-pay | Admitting: Radiation Oncology

## 2017-05-30 ENCOUNTER — Ambulatory Visit
Admission: RE | Admit: 2017-05-30 | Discharge: 2017-05-30 | Disposition: A | Discharge status: Home / Self Care | Payer: Medicare HMO | Source: Ambulatory Visit | Attending: Radiation Oncology | Admitting: Radiation Oncology

## 2017-05-30 DIAGNOSIS — Z7984 Long term (current) use of oral hypoglycemic drugs: Secondary | ICD-10-CM | POA: Insufficient documentation

## 2017-05-30 DIAGNOSIS — Z171 Estrogen receptor negative status [ER-]: Secondary | ICD-10-CM | POA: Diagnosis not present

## 2017-05-30 DIAGNOSIS — Z79899 Other long term (current) drug therapy: Secondary | ICD-10-CM | POA: Insufficient documentation

## 2017-05-30 DIAGNOSIS — D0512 Intraductal carcinoma in situ of left breast: Secondary | ICD-10-CM | POA: Insufficient documentation

## 2017-05-30 DIAGNOSIS — Z7982 Long term (current) use of aspirin: Secondary | ICD-10-CM | POA: Diagnosis not present

## 2017-05-30 DIAGNOSIS — Z9889 Other specified postprocedural states: Secondary | ICD-10-CM | POA: Diagnosis not present

## 2017-05-30 NOTE — Progress Notes (Signed)
Radiation Oncology         (336) [phone] ________________________________  Name: Maria Collier MRN: [id number]  Date: 05/30/2017  DOB: 08/02/1933  Re-Consultation Note  CC: Kelton Pillar, MD  Nicholas Lose, MD    ICD-10-CM   1. Ductal carcinoma in situ (DCIS) of left breast D05.12     Diagnosis:  Ductal carcinoma in situ (DCIS) of left breast.  Narrative:  The patient returns today for routine follow up and to discuss radiation therapy treatment options. She is accompanied by her interpreter, Lelon Frohlich today. Her Medical Oncologist is Dr. Lindi Adie. She reports that she is doing well since her surgery and she felt pruritis to her skin which she treated with tylenol.   Since her last visit to the office, she underwent a left breast lumpectomy on 05/15/2017 with results of: Breast, lumpectomy, Left with high grade ductal carcinoma in situ with calcifications and necrosis. No invasive carcinoma identified.  The tumor was estimated to be 2 cm. The surgical margins were clear the closest margin and greater than 1 cm  On review of systems, she reports denies any other symptoms.   The patient is accompanied by Lelon Frohlich, Sign Language Interpreter today, who the patient has elected to translate for her during this visit.                                 ALLERGIES:  is allergic to altace [ramipril]; hyzaar [losartan potassium-hctz]; and hydrocodone.  Meds: Current Outpatient Medications  Medication Sig Dispense Refill  . acetaminophen (TYLENOL) 500 MG tablet Take 500 mg by mouth daily as needed for mild pain or headache.    Marland Kitchen aspirin 81 MG tablet Take 81 mg by mouth at bedtime.     . cholecalciferol (VITAMIN D) 1000 units tablet Take 1,000 Units by mouth daily.    . hydrochlorothiazide (HYDRODIURIL) 25 MG tablet Take 25 mg by mouth daily.    . metFORMIN (GLUCOPHAGE-XR) 500 MG 24 hr tablet Take 500 mg by mouth every evening.    . metoprolol succinate (TOPROL-XL) 100 MG 24 hr tablet Take 100 mg by mouth  daily. Take with or immediately following a meal.    . Polyethyl Glycol-Propyl Glycol (SYSTANE OP) Apply 1 drop to eye 4 (four) times daily as needed (dry eyes).    . simvastatin (ZOCOR) 40 MG tablet Take 40 mg by mouth every evening.     . vitamin C (ASCORBIC ACID) 500 MG tablet Take 500 mg by mouth daily.    . vitamin E (VITAMIN E) 1000 UNIT capsule Take 1,000 Units by mouth daily.     No current facility-administered medications for this encounter.     Physical Findings: The patient is in no acute distress. Patient is alert and oriented.  height is 5\' 2"  (1.575 m) and weight is 129 lb 6.4 oz (58.7 kg). Her oral temperature is 98.2 F (36.8 C). Her blood pressure is 155/56 (abnormal) and her pulse is 68. Her oxygen saturation is 99%.  Lungs are clear to auscultation bilaterally. Heart has regular rate and rhythm. No palpable cervical, supraclavicular, or axillary adenopathy. Abdomen soft, non-tender, normal bowel sounds. Right breast with no palpable mass or nipple discharge. Left breast with well healing scar in the UOQ. No signs of drainage or infection.    Lab Findings: Lab Results  Component Value Date   WBC 9.1 05/09/2017   HGB 12.4 05/09/2017   HCT 39.2 05/09/2017  MCV 96.6 05/09/2017   PLT 196 05/09/2017    Radiographic Findings: No results found.  Impression: High-grade DCIS of left breast. Patient's tumor was found to be ER and PR negative as well as high grade and therefore would recommend radiation therapy as breast conservation treatment.   We discussed the general course of radiation, potential side effects, and toxicities with radiation with the patient and her Sign Language Interpreter, Ann. The patient appears to understand and would like to proceed with this course of treatment. A consent form was signed and placed in her chart today.  Plan: Patient will follow up for a CT simulation planning appointment on 06/13/2017.  Anticipate 4 weeks of radiation  therapy   ____________________________________  Blair Promise, PhD, MD    This document serves as a record of services personally performed by Gery Pray, MD. It was created on his behalf by [facility], a trained medical scribe. The creation of this record is based on the scribe's personal observations and the provider's statements to them. This document has been checked and approved by the attending provider.

## 2017-05-30 NOTE — Assessment & Plan Note (Deleted)
Since the patient is deaf, interview was conducted with the help of a sign language interpreter.  03/28/2017:Screening detected left breast calcifications 3.3 cm span axilla negative biopsy revealed high-grade DCIS with calcifications ER 0%, PR 0%, Tis N0 stage 0  05/15/2017:Left lumpectomy: High-grade DCIS with calcifications and necrosis, no invasive cancer identified, ER 0%, PR 0%, Tis NX stage 0  Pathology counseling: I discussed the final pathology report of the patient provided  a copy of this report. We also discussed the final staging along with previously performed ER/PR tests  Recommendation: Adjuvant radiation therapy No role of antiestrogen therapy since she is here PR negative.  Follow-up with surgery for long-term surveillance.

## 2017-05-31 ENCOUNTER — Telehealth: Payer: Self-pay | Admitting: Hematology and Oncology

## 2017-05-31 NOTE — Telephone Encounter (Signed)
Mailed patient calendar of upcoming August appointments per 2/21 sch message.

## 2017-06-10 DIAGNOSIS — H00012 Hordeolum externum right lower eyelid: Secondary | ICD-10-CM | POA: Diagnosis not present

## 2017-06-10 DIAGNOSIS — E119 Type 2 diabetes mellitus without complications: Secondary | ICD-10-CM | POA: Diagnosis not present

## 2017-06-10 DIAGNOSIS — H52203 Unspecified astigmatism, bilateral: Secondary | ICD-10-CM | POA: Diagnosis not present

## 2017-06-10 DIAGNOSIS — Z961 Presence of intraocular lens: Secondary | ICD-10-CM | POA: Diagnosis not present

## 2017-06-12 ENCOUNTER — Inpatient Hospital Stay: Payer: Medicare HMO

## 2017-06-12 ENCOUNTER — Inpatient Hospital Stay: Payer: Medicare HMO | Attending: Genetic Counselor | Admitting: Genetic Counselor

## 2017-06-12 ENCOUNTER — Ambulatory Visit
Admission: RE | Admit: 2017-06-12 | Discharge: 2017-06-12 | Disposition: A | Discharge status: Home / Self Care | Payer: Medicare HMO | Source: Ambulatory Visit | Attending: Radiation Oncology | Admitting: Radiation Oncology

## 2017-06-12 ENCOUNTER — Encounter: Payer: Self-pay | Admitting: Genetic Counselor

## 2017-06-12 DIAGNOSIS — Z8371 Family history of colonic polyps: Secondary | ICD-10-CM

## 2017-06-12 DIAGNOSIS — D0512 Intraductal carcinoma in situ of left breast: Secondary | ICD-10-CM | POA: Diagnosis not present

## 2017-06-12 DIAGNOSIS — Z8041 Family history of malignant neoplasm of ovary: Secondary | ICD-10-CM | POA: Diagnosis not present

## 2017-06-12 DIAGNOSIS — Z803 Family history of malignant neoplasm of breast: Secondary | ICD-10-CM | POA: Diagnosis not present

## 2017-06-12 NOTE — Progress Notes (Signed)
  Radiation Oncology         (336) [phone] ________________________________  Name: Maria Collier MRN: [id number]  Date: 06/12/2017  DOB: 05/05/1933  SIMULATION AND TREATMENT PLANNING NOTE    ICD-10-CM   1. Ductal carcinoma in situ (DCIS) of left breast D05.12     DIAGNOSIS: High-grade DCIS of left breast.  NARRATIVE:  The patient was brought to the [facility].  Identity was confirmed.  All relevant records and images related to the planned course of therapy were reviewed.  The patient freely provided informed written consent to proceed with treatment after reviewing the details related to the planned course of therapy. The consent form was witnessed and verified by the simulation staff.  Then, the patient was set-up in a stable reproducible  supine position for radiation therapy.  CT images were obtained.  Surface markings were placed.  The CT images were loaded into the planning software.  Then the target and avoidance structures were contoured.  Treatment planning then occurred.  The radiation prescription was entered and confirmed.  Then, I designed and supervised the construction of a total of 3 medically necessary complex treatment devices.  I have requested : 3D Simulation  I have requested a DVH of the following structures: Heart, lungs, lumpectomy cavity.  I have ordered:CBC  PLAN:  The patient will receive 40.05 Gy in 15 fractions followed by a boost to the lumpectomy cavity of 10 gray in 5 fractions.   Optical Surface Tracking Plan:  Since intensity modulated radiotherapy (IMRT) and 3D conformal radiation treatment methods are predicated on accurate and precise positioning for treatment, intrafraction motion monitoring is medically necessary to ensure accurate and safe treatment delivery.  The ability to quantify intrafraction motion without excessive ionizing radiation dose can only be performed with optical surface tracking. Accordingly, surface imaging offers the  opportunity to obtain 3D measurements of patient position throughout IMRT and 3D treatments without excessive radiation exposure.  I am ordering optical surface tracking for this patient's upcoming course of radiotherapy. ________________________________    -----------------------------------  Blair Promise, PhD, MD  This document serves as a record of services personally performed by Gery Pray, MD. It was created on his behalf by Arlyce Harman, a trained medical scribe. The creation of this record is based on the scribe's personal observations and the provider's statements to them. This document has been checked and approved by the attending provider.

## 2017-06-12 NOTE — Progress Notes (Signed)
REFERRING PROVIDER: Nicholas Lose, MD [address] [place], [place] [address]  PRIMARY PROVIDER:  Kelton Pillar, MD  PRIMARY REASON FOR VISIT:  1. Ductal carcinoma in situ (DCIS) of left breast   2. Family history of breast cancer   3. Family history of ovarian cancer   4. Family history of colonic polyps      HISTORY OF PRESENT ILLNESS:   Maria Collier, a 84 y.o. female, was seen for a [facility] cancer genetics consultation at the request of Dr. Lindi Collier due to a personal and family history of cancer.  Maria Collier presents to clinic today to discuss the possibility of a hereditary predisposition to cancer, genetic testing, and to further clarify her future cancer risks, as well as potential cancer risks for family members.   In 2018, at the age of 64, Maria Collier was diagnosed with DCIS of the left breast.  The tumor is ER-/PR-. This was treated with lumpectomy and radiation. The patient is deaf.  She developed meningitis at age 59-3 and lost her hearing at that time.  She is seen today with her interpreter.     CANCER HISTORY:    Ductal carcinoma in situ (DCIS) of left breast   03/27/2017 Initial Diagnosis    Screening detected left breast calcifications 3.3 cm span axilla negative biopsy revealed high-grade DCIS with calcifications ER 0%, PR 0%, Tis N0 stage 0      05/15/2017 Surgery    Left lumpectomy: High-grade DCIS with calcifications and necrosis, no invasive cancer identified, ER 0%, PR 0%, Tis NX stage 0        HORMONAL RISK FACTORS:  Menarche was at age 89.  First live birth at age 70.  OCP use for approximately <5 years.  Ovaries intact: yes.  Hysterectomy: Yes.  Menopausal status: postmenopausal.  HRT use: 0 years. Colonoscopy: yes; normal. Mammogram within the last year: yes. Number of breast biopsies: 1. Up to date with pelvic exams:  no. Any excessive radiation exposure in the past:  no  Past Medical History:  Diagnosis Date  . Deafness 1939   following meningitis  . Diabetes mellitus without complication ([place])   . Dyspnea    at night if the house is hot  . Family history of breast cancer   . Family history of colonic polyps   . Family history of ovarian cancer   . GERD (gastroesophageal reflux disease)    occasionally  . Headache    sometimes  . Hyperlipidemia   . Hypertension   . Meningitis   . Osteoporosis   . Pneumonia    yrs. ago    Past Surgical History:  Procedure Laterality Date  . ABDOMINAL HYSTERECTOMY    . BREAST LUMPECTOMY WITH RADIOACTIVE SEED LOCALIZATION Left 05/15/2017   Procedure: BREAST LUMPECTOMY WITH RADIOACTIVE SEED LOCALIZATION;  Surgeon: Fanny Skates, MD;  Location: [facility];  Service: General;  Laterality: Left;    Social History   Socioeconomic History  . Marital status: Married    Spouse name: Not on file  . Number of children: Not on file  . Years of education: Not on file  . Highest education level: Not on file  Social Needs  . Financial resource strain: Not on file  . Food insecurity - worry: Not on file  . Food insecurity - inability: Not on file  . Transportation needs - medical: Not on file  . Transportation needs - non-medical: Not on file  Occupational History  . Not on file  Tobacco Use  . Smoking status: Never Smoker  . Smokeless tobacco: Never Used  Substance and Sexual Activity  . Alcohol use: No    Frequency: Never  . Drug use: No  . Sexual activity: Not on file  Other Topics Concern  . Not on file  Social History Narrative  . Not on file     FAMILY HISTORY:  We obtained a detailed, 4-generation family history.  Significant diagnoses are listed below: Family History  Problem Relation Age of Onset  . Ovarian cancer Daughter        49s  . Breast cancer Daughter 31  . Heart disease Father   . Cancer Paternal Grandmother        ? stomach cancer  . Cancer Paternal Grandfather   . Other Sister        d. 58s, had stomach issues, e.g. ulcers  . Hypertension  Brother   . Colon polyps Daughter        6 polyps on first colonoscopy    The patient has two daughters.  Her oldest daughter, Maria Collier, was diagnosed with ovarian cancer at age 41 and breast cancer at 52.  It is unknown if she had genetic testing.  Her younger daughter has had 6 colon polyps found on her first colonoscopy.  The patient has a sister and brother.  Her sister died in her 58's from "stomach issues".  Both parents are deceased.  The patient's mother died of old age at 46.  She had three brothers and three sisters who were all cancer free.  The maternal grandparents died of unknown causes.  The patient's father died of heart disease at 109.  He had 9 siblings, of which the patient did not know much about.  His mother died of stomach cancer and his father died of unknown causes.  Maria Collier is unaware of previous family history of genetic testing for hereditary cancer risks. Patient's maternal ancestors are of Caucasian descent, and paternal ancestors are of Caucasian descent. There is no reported Ashkenazi Jewish ancestry. There is no known consanguinity.  GENETIC COUNSELING ASSESSMENT: Maria Collier is a 84 y.o. female with a personal history of breast cancer and family history of breast and ovarian cancer which is somewhat suggestive of a hereditary cancer syndrome and predisposition to cancer. We, therefore, discussed and recommended the following at today's visit.   DISCUSSION: We discussed that about 5-10% of breast cancer is hereditary with most cases due to BRCA mutations.  There are other genes associated with hereditary breast cancer syndromes, including ATM, CHEK2 and PALB2.  We discussed that her family history is not very concerning due to ages of onset, with the exception of her daughter with breast and ovarian cancer.  The patient does not know if her daughter has had genetic testing. We discussed that her personal history of breast cancer at age 52 is not very concerning for a  hereditary cancer syndrome, but she meets criteria based on her daughter.  Her daughter's father could have a hereditary cancer syndrome running in the family that could be causing her daughters cancer and her other daughter's colon polyps.  The patient will talk with her daughter about genetic testing.    We reviewed the characteristics, features and inheritance patterns of hereditary cancer syndromes. We also discussed genetic testing, including the appropriate family members to test, the process of testing, insurance coverage and turn-around-time for results. We discussed the implications of a negative, positive and/or variant of uncertain  significant result. We recommended Maria Collier pursue genetic testing for the common hereditary cancer gene panel. The Hereditary Gene Panel offered by [facility] includes sequencing and/or deletion duplication testing of the following 47 genes: APC, ATM, AXIN2, BARD1, BMPR1A, BRCA1, BRCA2, BRIP1, CDH1, CDK4, CDKN2A (p14ARF), CDKN2A (p16INK4a), CHEK2, CTNNA1, DICER1, EPCAM (Deletion/duplication testing only), GREM1 (promoter region deletion/duplication testing only), KIT, MEN1, MLH1, MSH2, MSH3, MSH6, MUTYH, NBN, NF1, NHTL1, PALB2, PDGFRA, PMS2, POLD1, POLE, PTEN, RAD50, RAD51C, RAD51D, SDHB, SDHC, SDHD, SMAD4, SMARCA4. STK11, TP53, TSC1, TSC2, and VHL.  The following genes were evaluated for sequence changes only: SDHA and HOXB13 c.251G>A variant only.   Based on Maria Collier's personal and family history of cancer, she meets medical criteria for genetic testing. Despite that she meets criteria, she may still have an out of pocket cost. We discussed that if her out of pocket cost for testing is over $100, the laboratory will call and confirm whether she wants to proceed with testing.  If the out of pocket cost of testing is less than $100 she will be billed by the genetic testing laboratory.   PLAN: After considering the risks, benefits, and limitations, Maria Collier  provided  informed consent to pursue genetic testing and the blood sample was sent to [facility] for analysis of the Common hereditary cancer panel. Results should be available within approximately 2-3 weeks' time, at which point they will be disclosed by telephone to Maria Collier, as will any additional recommendations warranted by these results. Maria Collier will receive a summary of her genetic counseling visit and a copy of her results once available. This information will also be available in Epic. We encouraged Maria Collier to remain in contact with cancer genetics annually so that we can continuously update the family history and inform her of any changes in cancer genetics and testing that may be of benefit for her family. Maria Collier questions were answered to her satisfaction today. Our contact information was provided should additional questions or concerns arise.  Based on Maria Collier's family history, we recommended her daughter, Maria Collier, who was diagnosed with ovarian cancer at age 19 and breast cancer at 56, have genetic counseling and testing. Maria Collier will let us know if we can be of any assistance in coordinating genetic counseling and/or testing for this family member.   Lastly, we encouraged Maria Collier to remain in contact with cancer genetics annually so that we can continuously update the family history and inform her of any changes in cancer genetics and testing that may be of benefit for this family.   Ms.  Collier questions were answered to her satisfaction today. Our contact information was provided should additional questions or concerns arise. Thank you for the referral and allowing [place] to share in the care of your patient.   Djuna Frechette P. Florene Glen, [place], [facility] Certified Genetic Counselor [email or web address] phone: [phone]  The patient was seen for a total of 60 minutes in face-to-face genetic counseling.  This patient was discussed with Drs. Magrinat, Maria Collier and/or Burr Medico who agrees with  the above.    _______________________________________________________________________ For Office Staff:  Number of people involved in session: 2 Was an Intern/ student involved with case: no

## 2017-06-13 ENCOUNTER — Ambulatory Visit: Payer: Medicare HMO | Admitting: Radiation Oncology

## 2017-06-18 DIAGNOSIS — D0512 Intraductal carcinoma in situ of left breast: Secondary | ICD-10-CM | POA: Diagnosis not present

## 2017-06-20 ENCOUNTER — Ambulatory Visit: Payer: Medicare HMO | Admitting: Radiation Oncology

## 2017-06-24 ENCOUNTER — Ambulatory Visit: Payer: Medicare HMO

## 2017-06-25 ENCOUNTER — Ambulatory Visit: Payer: Medicare HMO

## 2017-06-26 ENCOUNTER — Ambulatory Visit: Payer: Medicare HMO

## 2017-06-27 ENCOUNTER — Ambulatory Visit
Admission: RE | Admit: 2017-06-27 | Discharge: 2017-06-27 | Disposition: A | Discharge status: Home / Self Care | Payer: Medicare HMO | Source: Ambulatory Visit | Attending: Radiation Oncology | Admitting: Radiation Oncology

## 2017-06-27 DIAGNOSIS — D0512 Intraductal carcinoma in situ of left breast: Secondary | ICD-10-CM | POA: Diagnosis not present

## 2017-06-27 NOTE — Progress Notes (Signed)
  Radiation Oncology         [phone] ________________________________  Name: Maria Collier MRN: [id number]  Date: 06/27/2017  DOB: 02-29-1934  Simulation Verification Note    ICD-10-CM   1. Ductal carcinoma in situ (DCIS) of left breast D05.12     Status: outpatient  NARRATIVE: The patient was brought to the treatment unit and placed in the planned treatment position. The clinical setup was verified. Then port films were obtained and uploaded to the radiation oncology medical record software.  The treatment beams were carefully compared against the planned radiation fields. The position location and shape of the radiation fields was reviewed. They targeted volume of tissue appears to be appropriately covered by the radiation beams. Organs at risk appear to be excluded as planned.  Based on my personal review, I approved the simulation verification. The patient's treatment will proceed as planned.  -----------------------------------  Blair Promise, PhD, MD

## 2017-06-28 ENCOUNTER — Ambulatory Visit
Admission: RE | Admit: 2017-06-28 | Discharge: 2017-06-28 | Disposition: A | Discharge status: Home / Self Care | Payer: Medicare HMO | Source: Ambulatory Visit | Attending: Radiation Oncology | Admitting: Radiation Oncology

## 2017-06-28 DIAGNOSIS — D0512 Intraductal carcinoma in situ of left breast: Secondary | ICD-10-CM | POA: Diagnosis not present

## 2017-07-01 ENCOUNTER — Ambulatory Visit
Admission: RE | Admit: 2017-07-01 | Discharge: 2017-07-01 | Disposition: A | Discharge status: Home / Self Care | Payer: Medicare HMO | Source: Ambulatory Visit | Attending: Radiation Oncology | Admitting: Radiation Oncology

## 2017-07-01 DIAGNOSIS — D0512 Intraductal carcinoma in situ of left breast: Secondary | ICD-10-CM | POA: Diagnosis not present

## 2017-07-02 ENCOUNTER — Ambulatory Visit: Payer: Self-pay | Admitting: Genetic Counselor

## 2017-07-02 ENCOUNTER — Telehealth: Payer: Self-pay | Admitting: Genetic Counselor

## 2017-07-02 ENCOUNTER — Other Ambulatory Visit: Payer: Self-pay | Admitting: Radiation Oncology

## 2017-07-02 ENCOUNTER — Ambulatory Visit
Admission: RE | Admit: 2017-07-02 | Discharge: 2017-07-02 | Disposition: A | Discharge status: Home / Self Care | Payer: Medicare HMO | Source: Ambulatory Visit | Attending: Radiation Oncology | Admitting: Radiation Oncology

## 2017-07-02 ENCOUNTER — Encounter: Payer: Self-pay | Admitting: Genetic Counselor

## 2017-07-02 DIAGNOSIS — D0512 Intraductal carcinoma in situ of left breast: Secondary | ICD-10-CM | POA: Diagnosis not present

## 2017-07-02 DIAGNOSIS — Z803 Family history of malignant neoplasm of breast: Secondary | ICD-10-CM

## 2017-07-02 DIAGNOSIS — Z8041 Family history of malignant neoplasm of ovary: Secondary | ICD-10-CM

## 2017-07-02 DIAGNOSIS — Z8371 Family history of colonic polyps: Secondary | ICD-10-CM

## 2017-07-02 DIAGNOSIS — Z1379 Encounter for other screening for genetic and chromosomal anomalies: Secondary | ICD-10-CM | POA: Insufficient documentation

## 2017-07-02 MED ORDER — ALRA NON-METALLIC DEODORANT (RAD-ONC)
1.0000 "application " | Freq: Once | TOPICAL | Status: AC
  Administered 2017-07-02: 1 via TOPICAL

## 2017-07-02 MED ORDER — LORAZEPAM 0.5 MG PO TABS: 0.5000 mg | ORAL_TABLET | Freq: Three times a day (TID) | ORAL | 0 refills | Status: DC | PRN

## 2017-07-02 MED ORDER — RADIAPLEXRX EX GEL
Freq: Once | CUTANEOUS | Status: AC
  Administered 2017-07-02: 11:00:00 via TOPICAL

## 2017-07-02 NOTE — Telephone Encounter (Signed)
Using telephonic interpreter services the following was relayed: Revealed negative genetic testing.  Discussed that we do not know why she has breast cancer or why there is cancer in the family. It could be due to a different gene that we are not testing, or maybe our current technology may not be able to pick something up.  It will be important for her to keep in contact with genetics to keep up with whether additional testing may be needed.   CHEK2 VUS was found.  This is still a normal result.  We will call once it is reclassified.

## 2017-07-02 NOTE — Progress Notes (Signed)
HPI: Ms. Blanchard was previously seen in the [facility] clinic due to a personal and family history of cancer and concerns regarding a hereditary predisposition to cancer. Please refer to our prior cancer genetics clinic note for more information regarding Ms. Kasik's medical, social and family histories, and our assessment and recommendations, at the time. Ms. Lisbon recent genetic test results were disclosed to her, as were recommendations warranted by these results. These results and recommendations are discussed in more detail below.  CANCER HISTORY:    Ductal carcinoma in situ (DCIS) of left breast   03/27/2017 Initial Diagnosis    Screening detected left breast calcifications 3.3 cm span axilla negative biopsy revealed high-grade DCIS with calcifications ER 0%, PR 0%, Tis N0 stage 0      05/15/2017 Surgery    Left lumpectomy: High-grade DCIS with calcifications and necrosis, no invasive cancer identified, ER 0%, PR 0%, Tis NX stage 0      07/01/2017 Genetic Testing    CHEK2 c.1448A>G VUS identified on the common hereditary cancer panel.  The Hereditary Gene Panel offered by [facility] includes sequencing and/or deletion duplication testing of the following 47 genes: APC, ATM, AXIN2, BARD1, BMPR1A, BRCA1, BRCA2, BRIP1, CDH1, CDK4, CDKN2A (p14ARF), CDKN2A (p16INK4a), CHEK2, CTNNA1, DICER1, EPCAM (Deletion/duplication testing only), GREM1 (promoter region deletion/duplication testing only), KIT, MEN1, MLH1, MSH2, MSH3, MSH6, MUTYH, NBN, NF1, NHTL1, PALB2, PDGFRA, PMS2, POLD1, POLE, PTEN, RAD50, RAD51C, RAD51D, SDHB, SDHC, SDHD, SMAD4, SMARCA4. STK11, TP53, TSC1, TSC2, and VHL.  The following genes were evaluated for sequence changes only: SDHA and HOXB13 c.251G>A variant only. The report date is July 01, 2017.        FAMILY HISTORY:  We obtained a detailed, 4-generation family history.  Significant diagnoses are listed below: Family History  Problem Relation Age of Onset  .  Ovarian cancer Daughter        78s  . Breast cancer Daughter 94  . Heart disease Father   . Cancer Paternal Grandmother        ? stomach cancer  . Cancer Paternal Grandfather   . Other Sister        d. 23s, had stomach issues, e.g. ulcers  . Hypertension Brother   . Colon polyps Daughter        6 polyps on first colonoscopy    The patient has two daughters.  Her oldest daughter, [name], was diagnosed with ovarian cancer at age 75 and breast cancer at 104.  It is unknown if she had genetic testing.  Her younger daughter has had 6 colon polyps found on her first colonoscopy.  The patient has a sister and brother.  Her sister died in her 23's from "stomach issues".  Both parents are deceased.  The patient's mother died of old age at 44.  She had three brothers and three sisters who were all cancer free.  The maternal grandparents died of unknown causes.  The patient's father died of heart disease at 63.  He had 9 siblings, of which the patient did not know much about.  His mother died of stomach cancer and his father died of unknown causes.  Ms. Honsinger is unaware of previous family history of genetic testing for hereditary cancer risks. Patient's maternal ancestors are of Caucasian descent, and paternal ancestors are of Caucasian descent. There is no reported Ashkenazi Jewish ancestry. There is no known consanguinity.  GENETIC TEST RESULTS: Genetic testing reported out on July 02, 2017 through the common hereditary cancer panel  found no deleterious mutations.  The Hereditary Gene Panel offered by [facility] includes sequencing and/or deletion duplication testing of the following 47 genes: APC, ATM, AXIN2, BARD1, BMPR1A, BRCA1, BRCA2, BRIP1, CDH1, CDK4, CDKN2A (p14ARF), CDKN2A (p16INK4a), CHEK2, CTNNA1, DICER1, EPCAM (Deletion/duplication testing only), GREM1 (promoter region deletion/duplication testing only), KIT, MEN1, MLH1, MSH2, MSH3, MSH6, MUTYH, NBN, NF1, NHTL1, PALB2, PDGFRA, PMS2, POLD1,  POLE, PTEN, RAD50, RAD51C, RAD51D, SDHB, SDHC, SDHD, SMAD4, SMARCA4. STK11, TP53, TSC1, TSC2, and VHL.  The following genes were evaluated for sequence changes only: SDHA and HOXB13 c.251G>A variant only.   The test report has been scanned into EPIC and is located under the Molecular Pathology section of the Results Review tab.   We discussed with Ms. Harbuck that since the current genetic testing is not perfect, it is possible there may be a gene mutation in one of these genes that current testing cannot detect, but that chance is small. We also discussed, that it is possible that another gene that has not yet been discovered, or that we have not yet tested, is responsible for the cancer diagnoses in the family, and it is, therefore, important to remain in touch with cancer genetics in the future so that we can continue to offer Ms. Newsome the most up to date genetic testing.   Genetic testing did detect a Variant of Unknown Significance in the CHEK2 gene called c.1448A>G.  At this time, it is unknown if this variant is associated with increased cancer risk or if this is a normal finding, but most variants such as this get reclassified to being inconsequential. It should not be used to make medical management decisions. With time, we suspect the lab will determine the significance of this variant, if any. If we do learn more about it, we will try to contact Ms. Sonn to discuss it further. However, it is important to stay in touch with [place] periodically and keep the address and phone number up to date.     CANCER SCREENING RECOMMENDATIONS: Given Ms. Anding's personal and family histories, we must interpret these negative results with some caution.  Families with features suggestive of hereditary risk for cancer tend to have multiple family members with cancer, diagnoses in multiple generations and diagnoses before the age of 4. Ms. Younes's family exhibits some of these features. Thus this result may simply  reflect our current inability to detect all mutations within these genes or there may be a different gene that has not yet been discovered or tested.   An individual's cancer risk and medical management are not determined by genetic test results alone. Overall cancer risk assessment incorporates additional factors, including personal medical history, family history, and any available genetic information that may result in a personalized plan for cancer prevention and surveillance.  RECOMMENDATIONS FOR FAMILY MEMBERS: Women in this family might be at some increased risk of developing cancer, over the general population risk, simply due to the family history of cancer. We recommended women in this family have a yearly mammogram beginning at age 18, or 41 years younger than the earliest onset of cancer, an annual clinical breast exam, and perform monthly breast self-exams. Women in this family should also have a gynecological exam as recommended by their primary provider. All family members should have a colonoscopy by age 72.  FOLLOW-UP: Lastly, we discussed with Ms. Coleson that cancer genetics is a rapidly advancing field and it is possible that new genetic tests will be appropriate for her and/or her family  members in the future. We encouraged her to remain in contact with cancer genetics on an annual basis so we can update her personal and family histories and let her know of advances in cancer genetics that may benefit this family.   Our contact number was provided. Ms. Maudlin questions were answered to her satisfaction, and she knows she is welcome to call us at anytime with additional questions or concerns.   Roma Kayser, MS, [facility] Certified Genetic Counselor [email or web address]

## 2017-07-02 NOTE — Progress Notes (Signed)
Pt here for patient teaching.  Pt given Radiation and You booklet, skin care instructions, Alra deodorant and Radiaplex gel. Reviewed areas of pertinence such as fatigue and skin changes . Pt able to give teach back of to pat skin and use unscented/gentle soap,apply Radiaplex bid and avoid applying anything to skin within 4 hours of treatment. Pt demonstrated understanding and verbalizes understanding of information given and will contact nursing with any questions or concerns.        

## 2017-07-03 ENCOUNTER — Ambulatory Visit
Admission: RE | Admit: 2017-07-03 | Discharge: 2017-07-03 | Disposition: A | Discharge status: Home / Self Care | Payer: Medicare HMO | Source: Ambulatory Visit | Attending: Radiation Oncology | Admitting: Radiation Oncology

## 2017-07-03 DIAGNOSIS — D0512 Intraductal carcinoma in situ of left breast: Secondary | ICD-10-CM | POA: Diagnosis not present

## 2017-07-04 ENCOUNTER — Ambulatory Visit
Admission: RE | Admit: 2017-07-04 | Discharge: 2017-07-04 | Disposition: A | Discharge status: Home / Self Care | Payer: Medicare HMO | Source: Ambulatory Visit | Attending: Radiation Oncology | Admitting: Radiation Oncology

## 2017-07-04 DIAGNOSIS — D0512 Intraductal carcinoma in situ of left breast: Secondary | ICD-10-CM | POA: Diagnosis not present

## 2017-07-05 ENCOUNTER — Ambulatory Visit
Admission: RE | Admit: 2017-07-05 | Discharge: 2017-07-05 | Disposition: A | Discharge status: Home / Self Care | Payer: Medicare HMO | Source: Ambulatory Visit | Attending: Radiation Oncology | Admitting: Radiation Oncology

## 2017-07-05 DIAGNOSIS — D0512 Intraductal carcinoma in situ of left breast: Secondary | ICD-10-CM | POA: Diagnosis not present

## 2017-07-08 ENCOUNTER — Ambulatory Visit
Admission: RE | Admit: 2017-07-08 | Discharge: 2017-07-08 | Disposition: A | Discharge status: Home / Self Care | Payer: Medicare HMO | Source: Ambulatory Visit | Attending: Radiation Oncology | Admitting: Radiation Oncology

## 2017-07-08 DIAGNOSIS — D0512 Intraductal carcinoma in situ of left breast: Secondary | ICD-10-CM | POA: Diagnosis not present

## 2017-07-08 DIAGNOSIS — Z510 Encounter for antineoplastic radiation therapy: Secondary | ICD-10-CM | POA: Diagnosis not present

## 2017-07-08 DIAGNOSIS — Z170 Estrogen receptor positive status [ER+]: Secondary | ICD-10-CM | POA: Insufficient documentation

## 2017-07-09 ENCOUNTER — Ambulatory Visit
Admission: RE | Admit: 2017-07-09 | Discharge: 2017-07-09 | Disposition: A | Discharge status: Home / Self Care | Payer: Medicare HMO | Source: Ambulatory Visit | Attending: Radiation Oncology | Admitting: Radiation Oncology

## 2017-07-09 DIAGNOSIS — Z510 Encounter for antineoplastic radiation therapy: Secondary | ICD-10-CM | POA: Diagnosis not present

## 2017-07-09 DIAGNOSIS — D0512 Intraductal carcinoma in situ of left breast: Secondary | ICD-10-CM | POA: Diagnosis not present

## 2017-07-09 DIAGNOSIS — Z170 Estrogen receptor positive status [ER+]: Secondary | ICD-10-CM | POA: Diagnosis not present

## 2017-07-10 ENCOUNTER — Ambulatory Visit
Admission: RE | Admit: 2017-07-10 | Discharge: 2017-07-10 | Disposition: A | Discharge status: Home / Self Care | Payer: Medicare HMO | Source: Ambulatory Visit | Attending: Radiation Oncology | Admitting: Radiation Oncology

## 2017-07-10 DIAGNOSIS — Z510 Encounter for antineoplastic radiation therapy: Secondary | ICD-10-CM | POA: Diagnosis not present

## 2017-07-10 DIAGNOSIS — D0512 Intraductal carcinoma in situ of left breast: Secondary | ICD-10-CM | POA: Diagnosis not present

## 2017-07-10 DIAGNOSIS — Z170 Estrogen receptor positive status [ER+]: Secondary | ICD-10-CM | POA: Diagnosis not present

## 2017-07-11 ENCOUNTER — Ambulatory Visit
Admission: RE | Admit: 2017-07-11 | Discharge: 2017-07-11 | Disposition: A | Discharge status: Home / Self Care | Payer: Medicare HMO | Source: Ambulatory Visit | Attending: Radiation Oncology | Admitting: Radiation Oncology

## 2017-07-11 DIAGNOSIS — Z170 Estrogen receptor positive status [ER+]: Secondary | ICD-10-CM | POA: Diagnosis not present

## 2017-07-11 DIAGNOSIS — D0512 Intraductal carcinoma in situ of left breast: Secondary | ICD-10-CM | POA: Diagnosis not present

## 2017-07-11 DIAGNOSIS — Z510 Encounter for antineoplastic radiation therapy: Secondary | ICD-10-CM | POA: Diagnosis not present

## 2017-07-12 ENCOUNTER — Ambulatory Visit
Admission: RE | Admit: 2017-07-12 | Discharge: 2017-07-12 | Disposition: A | Discharge status: Home / Self Care | Payer: Medicare HMO | Source: Ambulatory Visit | Attending: Radiation Oncology | Admitting: Radiation Oncology

## 2017-07-12 DIAGNOSIS — D0512 Intraductal carcinoma in situ of left breast: Secondary | ICD-10-CM | POA: Diagnosis not present

## 2017-07-12 DIAGNOSIS — Z510 Encounter for antineoplastic radiation therapy: Secondary | ICD-10-CM | POA: Diagnosis not present

## 2017-07-12 DIAGNOSIS — Z170 Estrogen receptor positive status [ER+]: Secondary | ICD-10-CM | POA: Diagnosis not present

## 2017-07-15 ENCOUNTER — Ambulatory Visit
Admission: RE | Admit: 2017-07-15 | Discharge: 2017-07-15 | Disposition: A | Discharge status: Home / Self Care | Payer: Medicare HMO | Source: Ambulatory Visit | Attending: Radiation Oncology | Admitting: Radiation Oncology

## 2017-07-15 DIAGNOSIS — Z170 Estrogen receptor positive status [ER+]: Secondary | ICD-10-CM | POA: Diagnosis not present

## 2017-07-15 DIAGNOSIS — D0512 Intraductal carcinoma in situ of left breast: Secondary | ICD-10-CM | POA: Diagnosis not present

## 2017-07-15 DIAGNOSIS — Z510 Encounter for antineoplastic radiation therapy: Secondary | ICD-10-CM | POA: Diagnosis not present

## 2017-07-16 ENCOUNTER — Ambulatory Visit
Admission: RE | Admit: 2017-07-16 | Discharge: 2017-07-16 | Disposition: A | Discharge status: Home / Self Care | Payer: Medicare HMO | Source: Ambulatory Visit | Attending: Radiation Oncology | Admitting: Radiation Oncology

## 2017-07-16 DIAGNOSIS — D0512 Intraductal carcinoma in situ of left breast: Secondary | ICD-10-CM

## 2017-07-16 DIAGNOSIS — Z170 Estrogen receptor positive status [ER+]: Secondary | ICD-10-CM | POA: Diagnosis not present

## 2017-07-16 DIAGNOSIS — Z510 Encounter for antineoplastic radiation therapy: Secondary | ICD-10-CM | POA: Diagnosis not present

## 2017-07-16 NOTE — Progress Notes (Signed)
Simulation verification  The patient was brought to the treatment machine and placed in the plan treatment position.  Clinical set up was verified to ensure that the target region is appropriately covered for the patient's upcoming electron boost treatment.  The targeted volume of tissue is appropriately covered by the radiation field.  Based on my personal review, I approve the simulation verification.  The patient's treatment will proceed as planned.  ------------------------------------------------  Maria Collier

## 2017-07-17 ENCOUNTER — Ambulatory Visit
Admission: RE | Admit: 2017-07-17 | Discharge: 2017-07-17 | Disposition: A | Discharge status: Home / Self Care | Payer: Medicare HMO | Source: Ambulatory Visit | Attending: Radiation Oncology | Admitting: Radiation Oncology

## 2017-07-17 DIAGNOSIS — Z510 Encounter for antineoplastic radiation therapy: Secondary | ICD-10-CM | POA: Diagnosis not present

## 2017-07-17 DIAGNOSIS — D0512 Intraductal carcinoma in situ of left breast: Secondary | ICD-10-CM | POA: Diagnosis not present

## 2017-07-17 DIAGNOSIS — Z170 Estrogen receptor positive status [ER+]: Secondary | ICD-10-CM | POA: Diagnosis not present

## 2017-07-18 ENCOUNTER — Ambulatory Visit
Admission: RE | Admit: 2017-07-18 | Discharge: 2017-07-18 | Disposition: A | Discharge status: Home / Self Care | Payer: Medicare HMO | Source: Ambulatory Visit | Attending: Radiation Oncology | Admitting: Radiation Oncology

## 2017-07-18 DIAGNOSIS — D0512 Intraductal carcinoma in situ of left breast: Secondary | ICD-10-CM | POA: Diagnosis not present

## 2017-07-18 DIAGNOSIS — Z510 Encounter for antineoplastic radiation therapy: Secondary | ICD-10-CM | POA: Diagnosis not present

## 2017-07-18 DIAGNOSIS — Z170 Estrogen receptor positive status [ER+]: Secondary | ICD-10-CM | POA: Diagnosis not present

## 2017-07-19 ENCOUNTER — Ambulatory Visit
Admission: RE | Admit: 2017-07-19 | Discharge: 2017-07-19 | Disposition: A | Discharge status: Home / Self Care | Payer: Medicare HMO | Source: Ambulatory Visit | Attending: Radiation Oncology | Admitting: Radiation Oncology

## 2017-07-19 DIAGNOSIS — Z170 Estrogen receptor positive status [ER+]: Secondary | ICD-10-CM | POA: Diagnosis not present

## 2017-07-19 DIAGNOSIS — D0512 Intraductal carcinoma in situ of left breast: Secondary | ICD-10-CM | POA: Diagnosis not present

## 2017-07-19 DIAGNOSIS — Z510 Encounter for antineoplastic radiation therapy: Secondary | ICD-10-CM | POA: Diagnosis not present

## 2017-07-22 ENCOUNTER — Ambulatory Visit
Admission: RE | Admit: 2017-07-22 | Discharge: 2017-07-22 | Disposition: A | Discharge status: Home / Self Care | Payer: Medicare HMO | Source: Ambulatory Visit | Attending: Radiation Oncology | Admitting: Radiation Oncology

## 2017-07-22 DIAGNOSIS — Z170 Estrogen receptor positive status [ER+]: Secondary | ICD-10-CM | POA: Diagnosis not present

## 2017-07-22 DIAGNOSIS — D0512 Intraductal carcinoma in situ of left breast: Secondary | ICD-10-CM | POA: Diagnosis not present

## 2017-07-22 DIAGNOSIS — Z510 Encounter for antineoplastic radiation therapy: Secondary | ICD-10-CM | POA: Diagnosis not present

## 2017-07-23 ENCOUNTER — Ambulatory Visit
Admission: RE | Admit: 2017-07-23 | Discharge: 2017-07-23 | Disposition: A | Discharge status: Home / Self Care | Payer: Medicare HMO | Source: Ambulatory Visit | Attending: Radiation Oncology | Admitting: Radiation Oncology

## 2017-07-23 DIAGNOSIS — D0512 Intraductal carcinoma in situ of left breast: Secondary | ICD-10-CM | POA: Diagnosis not present

## 2017-07-23 DIAGNOSIS — Z510 Encounter for antineoplastic radiation therapy: Secondary | ICD-10-CM | POA: Diagnosis not present

## 2017-07-23 DIAGNOSIS — Z170 Estrogen receptor positive status [ER+]: Secondary | ICD-10-CM | POA: Diagnosis not present

## 2017-07-23 MED ORDER — RADIAPLEXRX EX GEL
Freq: Once | CUTANEOUS | Status: AC
  Administered 2017-07-23: 11:00:00 via TOPICAL

## 2017-07-24 ENCOUNTER — Ambulatory Visit
Admission: RE | Admit: 2017-07-24 | Discharge: 2017-07-24 | Disposition: A | Discharge status: Home / Self Care | Payer: Medicare HMO | Source: Ambulatory Visit | Attending: Radiation Oncology | Admitting: Radiation Oncology

## 2017-07-24 DIAGNOSIS — Z170 Estrogen receptor positive status [ER+]: Secondary | ICD-10-CM | POA: Diagnosis not present

## 2017-07-24 DIAGNOSIS — D0512 Intraductal carcinoma in situ of left breast: Secondary | ICD-10-CM | POA: Diagnosis not present

## 2017-07-24 DIAGNOSIS — Z510 Encounter for antineoplastic radiation therapy: Secondary | ICD-10-CM | POA: Diagnosis not present

## 2017-07-29 ENCOUNTER — Encounter: Payer: Self-pay | Admitting: Radiation Oncology

## 2017-07-29 NOTE — Progress Notes (Signed)
  Radiation Oncology         (336) [phone] ________________________________  Name: Maria Collier MRN: [id number]  Date: 07/29/2017  DOB: 12-16-1933  End of Treatment Note  Diagnosis:  High-grade DCIS of the left breast, ER/PR-   Indication for treatment: Curative    Radiation treatment dates: 06/27/17-07/24/17  Site/dose: 1) Left breast/ 40.05 Gy in 15 fractions 2) Left breast boost/ 10 Gy in 5 fractions  Beams/energy: 1) 3D/ 6X 2) Special teletherapy technique/ 12E, 9E  Narrative: The patient tolerated radiation treatment relatively well. The patient exhibited very little skin reaction during treatment. She complained of mild fatigue and mild tenderness with palpation to the treatment site.  Plan: The patient has completed radiation treatment. The patient will return to radiation oncology clinic for routine followup in one month. I advised them to call or return sooner if they have any questions or concerns related to their recovery or treatment.  -----------------------------------  Blair Promise, PhD, MD  This document serves as a record of services personally performed by Gery Pray, MD. It was created on his behalf by Bethann Humble, a trained medical scribe. The creation of this record is based on the scribe's personal observations and the provider's statements to them. This document has been checked and approved by the attending provider.

## 2017-08-06 DIAGNOSIS — E119 Type 2 diabetes mellitus without complications: Secondary | ICD-10-CM | POA: Diagnosis not present

## 2017-08-06 DIAGNOSIS — H43813 Vitreous degeneration, bilateral: Secondary | ICD-10-CM | POA: Diagnosis not present

## 2017-08-06 DIAGNOSIS — H35373 Puckering of macula, bilateral: Secondary | ICD-10-CM | POA: Diagnosis not present

## 2017-08-06 DIAGNOSIS — H33301 Unspecified retinal break, right eye: Secondary | ICD-10-CM | POA: Diagnosis not present

## 2017-08-29 ENCOUNTER — Ambulatory Visit
Admission: RE | Admit: 2017-08-29 | Discharge: 2017-08-29 | Disposition: A | Discharge status: Home / Self Care | Payer: Medicare HMO | Source: Ambulatory Visit | Attending: Radiation Oncology | Admitting: Radiation Oncology

## 2017-08-29 ENCOUNTER — Telehealth: Payer: Self-pay | Admitting: *Deleted

## 2017-08-29 ENCOUNTER — Encounter: Payer: Self-pay | Admitting: Radiation Oncology

## 2017-08-29 ENCOUNTER — Other Ambulatory Visit: Payer: Self-pay

## 2017-08-29 VITALS — BP 149/59 | HR 65 | Temp 97.5°F | Resp 20 | Ht 62.5 in | Wt 126.6 lb

## 2017-08-29 DIAGNOSIS — Z7984 Long term (current) use of oral hypoglycemic drugs: Secondary | ICD-10-CM | POA: Diagnosis not present

## 2017-08-29 DIAGNOSIS — Z79899 Other long term (current) drug therapy: Secondary | ICD-10-CM | POA: Diagnosis not present

## 2017-08-29 DIAGNOSIS — Z86000 Personal history of in-situ neoplasm of breast: Secondary | ICD-10-CM | POA: Insufficient documentation

## 2017-08-29 DIAGNOSIS — Z923 Personal history of irradiation: Secondary | ICD-10-CM | POA: Diagnosis not present

## 2017-08-29 DIAGNOSIS — Z7982 Long term (current) use of aspirin: Secondary | ICD-10-CM | POA: Diagnosis not present

## 2017-08-29 DIAGNOSIS — D0512 Intraductal carcinoma in situ of left breast: Secondary | ICD-10-CM

## 2017-08-29 MED ORDER — LORAZEPAM 0.5 MG PO TABS: 0.5000 mg | ORAL_TABLET | Freq: Three times a day (TID) | ORAL | 0 refills | Status: DC | PRN

## 2017-08-29 NOTE — Progress Notes (Signed)
Pt here for a follow-up today for left breast cancer. Pt denies having any pain. Pt states that she has occasional fatigue. Pt states that she has itching and is using hydrocortisone. Pt states that her skin looks good. Pt states that she is using the radiaplex gel.  BP (!) 149/59 (BP Location: Right Arm, Patient Position: Sitting, Cuff Size: Normal)   Pulse 65   Temp (!) 97.5 F (36.4 C) (Oral)   Resp 20   Ht 5' 2.5" (1.588 m)   Wt 126 lb 9.6 oz (57.4 kg)   SpO2 97%   BMI 22.79 kg/m    Wt Readings from Last 3 Encounters:  08/29/17 126 lb 9.6 oz (57.4 kg)  05/30/17 129 lb 6.4 oz (58.7 kg)  05/15/17 130 lb (59 kg)

## 2017-08-29 NOTE — Progress Notes (Signed)
Radiation Oncology         (336) [phone] ________________________________  Name: Maria Collier MRN: [id number]  Date: 08/29/2017  DOB: 1934-01-20  Follow-Up Visit Note  CC: Kelton Pillar, MD  Nicholas Lose, MD    ICD-10-CM   1. Ductal carcinoma in situ (DCIS) of left breast D05.12     Diagnosis:   84 y.o. female with High grade DCIS of the left breast, ER/PR negative  Interval Since Last Radiation:  5 weeks   Radiation treatment dates: 06/27/2017-07/24/2017 Site/dose:  1. Left Breast / 40.05 Gy in 15 fractions 2. Left Breast Boost / 10 Gy in 5 fractions  Narrative:  The patient returns today for one month follow-up after completing radiation therapy to her left breast. She is accompanied by her sign language interpreter. She denies any pain. She reports occasional fatigue. She reports itching and is using hydrocortisone cream. She states that her skin looks good, and she is using the Radiaplex gel.                          ALLERGIES:  is allergic to altace [ramipril]; hyzaar [losartan potassium-hctz]; and hydrocodone.  Meds: Current Outpatient Medications  Medication Sig Dispense Refill  . acetaminophen (TYLENOL) 500 MG tablet Take 500 mg by mouth daily as needed for mild pain or headache.    Marland Kitchen aspirin 81 MG tablet Take 81 mg by mouth at bedtime.     . cholecalciferol (VITAMIN D) 1000 units tablet Take 1,000 Units by mouth daily.    Marland Kitchen erythromycin ophthalmic ointment APPLY TO RIGHT LOWER LID TWICE A DAY FOR 2 WEEKS  2  . hyaluronate sodium (RADIAPLEXRX) GEL Apply 1 application topically 2 (two) times daily.    . hydrochlorothiazide (HYDRODIURIL) 25 MG tablet Take 25 mg by mouth daily.    Marland Kitchen LORazepam (ATIVAN) 0.5 MG tablet Take 1 tablet (0.5 mg total) by mouth every 8 (eight) hours as needed for anxiety or sleep. 60 tablet 0  . metFORMIN (GLUCOPHAGE-XR) 500 MG 24 hr tablet Take 500 mg by mouth every evening.    . metoprolol succinate (TOPROL-XL) 100 MG 24 hr tablet Take 100  mg by mouth daily. Take with or immediately following a meal.    . non-metallic deodorant (ALRA) MISC Apply 1 application topically daily as needed.    Vladimir Faster Glycol-Propyl Glycol (SYSTANE OP) Apply 1 drop to eye 4 (four) times daily as needed (dry eyes).    . simvastatin (ZOCOR) 40 MG tablet Take 40 mg by mouth every evening.     . vitamin C (ASCORBIC ACID) 500 MG tablet Take 500 mg by mouth daily.    . vitamin E (VITAMIN E) 1000 UNIT capsule Take 1,000 Units by mouth daily.     No current facility-administered medications for this encounter.     Physical Findings: The patient is in no acute distress. Patient is alert and oriented.  height is 5' 2.5" (1.588 m) and weight is 126 lb 9.6 oz (57.4 kg). Her oral temperature is 97.5 F (36.4 C) (abnormal). Her blood pressure is 149/59 (abnormal) and her pulse is 65. Her respiration is 20 and oxygen saturation is 97%.    Lungs are clear to auscultation bilaterally. Heart has regular rate and rhythm. No palpable cervical, supraclavicular, or axillary adenopathy. Abdomen soft, non-tender, normal bowel sounds.  Breast Exam Left Breast: Skin has healed well. Mild hyperpigmentation changes. No dominant mass. No nipple discharge or bleeding. Right Breast: No  palpable mass, nipple discharge or bleeding.  Lab Findings: Lab Results  Component Value Date   WBC 9.1 05/09/2017   HGB 12.4 05/09/2017   HCT 39.2 05/09/2017   MCV 96.6 05/09/2017   PLT 196 05/09/2017    Radiographic Findings: No results found.  Impression:  The patient is recovering from the effects of radiation.  She has some very mild fatigue. No evidence of recurrence on clinical exam.   Plan:  Routine follow up in radiation oncology in 6 months. She will meet with survivorship in about 3 months.   ____________________________________  Blair Promise, PhD, MD  This document serves as a record of services personally performed by Gery Pray, MD. It was created on his  behalf by Rae Lips, a trained medical scribe. The creation of this record is based on the scribe's personal observations and the provider's statements to them. This document has been checked and approved by the attending provider.

## 2017-08-29 NOTE — Telephone Encounter (Signed)
CALLED PATIENT TO INFORM OF 6 MONTH FU WITH DR. KINARD ON 03-03-18, SPOKE WITH INTERP. AND SHE IS AWARE OF THIS APPT.

## 2017-10-18 ENCOUNTER — Telehealth: Payer: Self-pay | Admitting: Adult Health

## 2017-10-18 NOTE — Telephone Encounter (Signed)
Tried to call the voicemail gave a different name so I did not leave a message. I did mail letter

## 2017-10-22 ENCOUNTER — Telehealth: Payer: Self-pay | Admitting: Adult Health

## 2017-10-22 NOTE — Telephone Encounter (Signed)
Returned call to patient re message left that she received a call, however she did not know if the call was for her or her spouse. Left message via video relay service confirming 8/26 SCP appointment.

## 2017-10-29 DIAGNOSIS — E119 Type 2 diabetes mellitus without complications: Secondary | ICD-10-CM | POA: Diagnosis not present

## 2017-10-29 DIAGNOSIS — H43813 Vitreous degeneration, bilateral: Secondary | ICD-10-CM | POA: Diagnosis not present

## 2017-10-29 DIAGNOSIS — H33011 Retinal detachment with single break, right eye: Secondary | ICD-10-CM | POA: Diagnosis not present

## 2017-10-30 DIAGNOSIS — E1121 Type 2 diabetes mellitus with diabetic nephropathy: Secondary | ICD-10-CM | POA: Diagnosis not present

## 2017-10-30 DIAGNOSIS — E7800 Pure hypercholesterolemia, unspecified: Secondary | ICD-10-CM | POA: Diagnosis not present

## 2017-11-06 DIAGNOSIS — M1990 Unspecified osteoarthritis, unspecified site: Secondary | ICD-10-CM | POA: Diagnosis not present

## 2017-11-06 DIAGNOSIS — Z0000 Encounter for general adult medical examination without abnormal findings: Secondary | ICD-10-CM | POA: Diagnosis not present

## 2017-11-06 DIAGNOSIS — E1121 Type 2 diabetes mellitus with diabetic nephropathy: Secondary | ICD-10-CM | POA: Diagnosis not present

## 2017-11-06 DIAGNOSIS — E7800 Pure hypercholesterolemia, unspecified: Secondary | ICD-10-CM | POA: Diagnosis not present

## 2017-11-06 DIAGNOSIS — E559 Vitamin D deficiency, unspecified: Secondary | ICD-10-CM | POA: Diagnosis not present

## 2017-11-06 DIAGNOSIS — N183 Chronic kidney disease, stage 3 (moderate): Secondary | ICD-10-CM | POA: Diagnosis not present

## 2017-11-06 DIAGNOSIS — I129 Hypertensive chronic kidney disease with stage 1 through stage 4 chronic kidney disease, or unspecified chronic kidney disease: Secondary | ICD-10-CM | POA: Diagnosis not present

## 2017-11-06 DIAGNOSIS — K219 Gastro-esophageal reflux disease without esophagitis: Secondary | ICD-10-CM | POA: Diagnosis not present

## 2017-11-06 DIAGNOSIS — E1140 Type 2 diabetes mellitus with diabetic neuropathy, unspecified: Secondary | ICD-10-CM | POA: Diagnosis not present

## 2017-11-06 DIAGNOSIS — M810 Age-related osteoporosis without current pathological fracture: Secondary | ICD-10-CM | POA: Diagnosis not present

## 2017-11-27 ENCOUNTER — Telehealth: Payer: Self-pay

## 2017-11-27 NOTE — Telephone Encounter (Signed)
LVM for pt reminding of SCP visit with NP on 8/26 at 2 pm.

## 2017-11-28 ENCOUNTER — Encounter: Payer: Medicare HMO | Admitting: Adult Health

## 2017-11-29 ENCOUNTER — Telehealth: Payer: Self-pay | Admitting: Adult Health

## 2017-11-29 NOTE — Telephone Encounter (Signed)
Spoke with translator regarding r/s appt per 8/22 vm

## 2017-12-02 ENCOUNTER — Inpatient Hospital Stay: Payer: Medicare HMO | Admitting: Adult Health

## 2017-12-25 DIAGNOSIS — R69 Illness, unspecified: Secondary | ICD-10-CM | POA: Diagnosis not present

## 2018-01-15 DIAGNOSIS — R69 Illness, unspecified: Secondary | ICD-10-CM | POA: Diagnosis not present

## 2018-01-30 DIAGNOSIS — R5383 Other fatigue: Secondary | ICD-10-CM | POA: Diagnosis not present

## 2018-01-30 DIAGNOSIS — M810 Age-related osteoporosis without current pathological fracture: Secondary | ICD-10-CM | POA: Diagnosis not present

## 2018-01-30 DIAGNOSIS — N183 Chronic kidney disease, stage 3 (moderate): Secondary | ICD-10-CM | POA: Diagnosis not present

## 2018-01-30 DIAGNOSIS — Z79899 Other long term (current) drug therapy: Secondary | ICD-10-CM | POA: Diagnosis not present

## 2018-01-30 DIAGNOSIS — E559 Vitamin D deficiency, unspecified: Secondary | ICD-10-CM | POA: Diagnosis not present

## 2018-01-31 ENCOUNTER — Emergency Department (HOSPITAL_COMMUNITY): Payer: Medicare HMO

## 2018-01-31 ENCOUNTER — Emergency Department (HOSPITAL_COMMUNITY)
Admission: EM | Admit: 2018-01-31 | Discharge: 2018-01-31 | Disposition: A | Discharge status: Home / Self Care | Payer: Medicare HMO | Attending: Emergency Medicine | Admitting: Emergency Medicine

## 2018-01-31 DIAGNOSIS — Z7984 Long term (current) use of oral hypoglycemic drugs: Secondary | ICD-10-CM | POA: Diagnosis not present

## 2018-01-31 DIAGNOSIS — E119 Type 2 diabetes mellitus without complications: Secondary | ICD-10-CM | POA: Diagnosis not present

## 2018-01-31 DIAGNOSIS — Z7982 Long term (current) use of aspirin: Secondary | ICD-10-CM | POA: Insufficient documentation

## 2018-01-31 DIAGNOSIS — S79912A Unspecified injury of left hip, initial encounter: Secondary | ICD-10-CM | POA: Diagnosis not present

## 2018-01-31 DIAGNOSIS — M25452 Effusion, left hip: Secondary | ICD-10-CM | POA: Diagnosis not present

## 2018-01-31 DIAGNOSIS — Z79899 Other long term (current) drug therapy: Secondary | ICD-10-CM | POA: Insufficient documentation

## 2018-01-31 DIAGNOSIS — I10 Essential (primary) hypertension: Secondary | ICD-10-CM | POA: Insufficient documentation

## 2018-01-31 DIAGNOSIS — M25552 Pain in left hip: Secondary | ICD-10-CM | POA: Insufficient documentation

## 2018-01-31 LAB — BASIC METABOLIC PANEL
Anion gap: 15 (ref 5–15)
BUN: 25 mg/dL — AB (ref 8–23)
CHLORIDE: 97 mmol/L — AB (ref 98–111)
CO2: 28 mmol/L (ref 22–32)
CREATININE: 0.92 mg/dL (ref 0.44–1.00)
Calcium: 10.2 mg/dL (ref 8.9–10.3)
GFR calc Af Amer: >60 mL/min (ref >60)
GFR calc non Af Amer: 57 mL/min — ABNORMAL LOW (ref >60)
Glucose, Bld: 154 mg/dL — ABNORMAL HIGH (ref 70–99)
POTASSIUM: 3.6 mmol/L (ref 3.5–5.1)
SODIUM: 140 mmol/L (ref 135–145)

## 2018-01-31 LAB — CBC WITH DIFFERENTIAL/PLATELET
ABS IMMATURE GRANULOCYTES: 0.02 10*3/uL (ref 0.00–0.07)
BASOS ABS: 0 10*3/uL (ref 0.0–0.1)
Basophils Relative: 0 %
EOS ABS: 0 10*3/uL (ref 0.0–0.5)
Eosinophils Relative: 0 %
HEMATOCRIT: 39.8 % (ref 36.0–46.0)
Hemoglobin: 12 g/dL (ref 12.0–15.0)
IMMATURE GRANULOCYTES: 0 %
LYMPHS ABS: 1.5 10*3/uL (ref 0.7–4.0)
Lymphocytes Relative: 16 %
MCH: 29.7 pg (ref 26.0–34.0)
MCHC: 30.2 g/dL (ref 30.0–36.0)
MCV: 98.5 fL (ref 80.0–100.0)
Monocytes Absolute: 1.1 10*3/uL — ABNORMAL HIGH (ref 0.1–1.0)
Monocytes Relative: 12 %
NEUTROS ABS: 6.7 10*3/uL (ref 1.7–7.7)
NEUTROS PCT: 72 %
NRBC: 0 % (ref 0.0–0.2)
PLATELETS: 201 10*3/uL (ref 150–400)
RBC: 4.04 MIL/uL (ref 3.87–5.11)
RDW: 14 % (ref 11.5–15.5)
WBC: 9.3 10*3/uL (ref 4.0–10.5)

## 2018-01-31 MED ORDER — MORPHINE SULFATE (PF) 4 MG/ML IV SOLN
4.0000 mg | Freq: Once | INTRAVENOUS | Status: AC
  Administered 2018-01-31: 4 mg via INTRAVENOUS
  Filled 2018-01-31: qty 1

## 2018-01-31 MED ORDER — ACETAMINOPHEN 500 MG PO TABS
1000.0000 mg | ORAL_TABLET | Freq: Once | ORAL | Status: AC
  Administered 2018-01-31: 1000 mg via ORAL
  Filled 2018-01-31: qty 2

## 2018-01-31 MED ORDER — SODIUM CHLORIDE 0.9 % IV BOLUS
500.0000 mL | Freq: Once | INTRAVENOUS | Status: AC
  Administered 2018-01-31: 500 mL via INTRAVENOUS

## 2018-01-31 MED ORDER — TRAMADOL HCL 50 MG PO TABS: 50.0000 mg | ORAL_TABLET | Freq: Four times a day (QID) | ORAL | 0 refills | Status: DC | PRN

## 2018-01-31 MED ORDER — ONDANSETRON HCL 4 MG/2ML IJ SOLN
4.0000 mg | Freq: Once | INTRAMUSCULAR | Status: AC
  Administered 2018-01-31: 4 mg via INTRAVENOUS
  Filled 2018-01-31: qty 2

## 2018-01-31 NOTE — ED Notes (Signed)
Interpreter called triage and advised she is working on getting someone here with pt. Hey would call back for Eta.

## 2018-01-31 NOTE — ED Notes (Addendum)
Interpreter with pt now.

## 2018-01-31 NOTE — Discharge Instructions (Addendum)
X-rays of your hip show no broken bones.  You do have a small ovarian cyst.  I suspect you have strained a muscle/ligament/tendon.  Ice pack.  Use your walker as needed.  Tylenol for pain.

## 2018-01-31 NOTE — ED Notes (Signed)
Patient transported to X-ray 

## 2018-01-31 NOTE — ED Triage Notes (Addendum)
Pt presents for evaluation of L hip pain. Pt denies injury. Pt is deaf and daughter accompanying patient is deaf as well. Daughter refuses mobile interpreter. Will call for in person interpreter.   Interpreter at bedside. Pt states she had bone density scan yesterday and had to lay with legs spread, states she was unable to bear weight or sleep last night due to pain. Still denies fall or injury.

## 2018-01-31 NOTE — ED Provider Notes (Signed)
[facility] EMERGENCY DEPARTMENT Provider Note   CSN: [id number] Arrival date & time: 01/31/18  1129     History   Chief Complaint Chief Complaint  Patient presents with  . Hip Pain    HPI Maria Collier is a 84 y.o. female.  Level 5 caveat for deafness.  History obtained from patient, friend, interpreter.  Patient complains of pain in the left inguinal area after awkward positioning while getting a bone density test yesterday.  No fall or traumatic injury to hip or pelvis.     Past Medical History:  Diagnosis Date  . Deafness 1939   following meningitis  . Diabetes mellitus without complication ([place])   . Dyspnea    at night if the house is hot  . Family history of breast cancer   . Family history of colonic polyps   . Family history of ovarian cancer   . GERD (gastroesophageal reflux disease)    occasionally  . Headache    sometimes  . Hyperlipidemia   . Hypertension   . Meningitis   . Osteoporosis   . Pneumonia    yrs. ago    Patient Active Problem List   Diagnosis Date Noted  . Genetic testing 07/02/2017  . Family history of breast cancer   . Family history of ovarian cancer   . Family history of colonic polyps   . Ductal carcinoma in situ (DCIS) of left breast 04/04/2017  . Atypical chest pain 07/21/2014  . Dyspnea 07/21/2014  . Hyperlipidemia   . Hypertension   . Diabetes mellitus without complication [facility])     Past Surgical History:  Procedure Laterality Date  . ABDOMINAL HYSTERECTOMY    . BREAST LUMPECTOMY WITH RADIOACTIVE SEED LOCALIZATION Left 05/15/2017   Procedure: BREAST LUMPECTOMY WITH RADIOACTIVE SEED LOCALIZATION;  Surgeon: Fanny Skates, MD;  Location: [facility];  Service: General;  Laterality: Left;     OB History   None      Home Medications    Prior to Admission medications   Medication Sig Start Date End Date Taking? Authorizing Provider  acetaminophen (TYLENOL) 500 MG tablet Take 500 mg by mouth daily as  needed for mild pain or headache.    [provider]  aspirin 81 MG tablet Take 81 mg by mouth at bedtime.     [provider]  cholecalciferol (VITAMIN D) 1000 units tablet Take 1,000 Units by mouth daily.    [provider]  erythromycin ophthalmic ointment APPLY TO RIGHT LOWER LID TWICE A DAY FOR 2 WEEKS 06/10/17   [provider]  hyaluronate sodium (RADIAPLEXRX) GEL Apply 1 application topically 2 (two) times daily.    [provider]  hydrochlorothiazide (HYDRODIURIL) 25 MG tablet Take 25 mg by mouth daily.    [provider]  LORazepam (ATIVAN) 0.5 MG tablet Take 1 tablet (0.5 mg total) by mouth every 8 (eight) hours as needed for anxiety or sleep. 08/29/17   Gery Pray, MD  metFORMIN (GLUCOPHAGE-XR) 500 MG 24 hr tablet Take 500 mg by mouth every evening.    [provider]  metoprolol succinate (TOPROL-XL) 100 MG 24 hr tablet Take 100 mg by mouth daily. Take with or immediately following a meal.    [provider]  non-metallic deodorant Jethro Poling) MISC Apply 1 application topically daily as needed.    [provider]  Polyethyl Glycol-Propyl Glycol (SYSTANE OP) Apply 1 drop to eye 4 (four) times daily as needed (dry eyes).  [provider]  simvastatin (ZOCOR) 40 MG tablet Take 40 mg by mouth every evening.     [provider]  traMADol (ULTRAM) 50 MG tablet Take 1 tablet (50 mg total) by mouth every 6 (six) hours as needed. 01/31/18   Nat Christen, MD  vitamin C (ASCORBIC ACID) 500 MG tablet Take 500 mg by mouth daily.    [provider]  vitamin E (VITAMIN E) 1000 UNIT capsule Take 1,000 Units by mouth daily.    [provider]    Family History Family History  Problem Relation Age of Onset  . Ovarian cancer Daughter        49s  . Breast cancer Daughter 48  . Heart disease Father   . Cancer Paternal Grandmother        ? stomach cancer  . Cancer Paternal Grandfather     . Other Sister        d. 54s, had stomach issues, e.g. ulcers  . Hypertension Brother   . Colon polyps Daughter        6 polyps on first colonoscopy    Social History Social History   Tobacco Use  . Smoking status: Never Smoker  . Smokeless tobacco: Never Used  Substance Use Topics  . Alcohol use: No    Frequency: Never  . Drug use: No     Allergies   Altace [ramipril]; Hyzaar [losartan potassium-hctz]; and Hydrocodone   Review of Systems Review of Systems  Unable to perform ROS: Other (Deafness)     Physical Exam Updated Vital Signs BP 129/61 (BP Location: Right Arm)   Pulse 87   Temp 98.6 F (37 C) (Oral)   Resp 18   SpO2 90%   Physical Exam  Constitutional: She is oriented to person, place, and time. She appears well-developed and well-nourished.  HENT:  Head: Normocephalic and atraumatic.  deaf  Eyes: Conjunctivae are normal.  Neck: Neck supple.  Cardiovascular: Normal rate and regular rhythm.  Pulmonary/Chest: Effort normal and breath sounds normal.  Abdominal: Soft. Bowel sounds are normal.  Musculoskeletal:  Tender inguinal area.  Neurological: She is alert and oriented to person, place, and time.  Skin: Skin is warm and dry.  Psychiatric: She has a normal mood and affect. Her behavior is normal.  Nursing note and vitals reviewed.    ED Treatments / Results  Labs (all labs ordered are listed, but only abnormal results are displayed) Labs Reviewed  CBC WITH DIFFERENTIAL/PLATELET - Abnormal; Notable for the following components:      Result Value   Monocytes Absolute 1.1 (*)    All other components within normal limits  BASIC METABOLIC PANEL - Abnormal; Notable for the following components:   Chloride 97 (*)    Glucose, Bld 154 (*)    BUN 25 (*)    GFR calc non Af Amer 57 (*)    All other components within normal limits    EKG None  Radiology Ct Hip Left Wo Contrast  Result Date: 01/31/2018 CLINICAL DATA:  84 year old female with  trauma to the left hip. Concern for fracture. EXAM: CT OF THE LEFT HIP WITHOUT CONTRAST TECHNIQUE: Multidetector CT imaging of the left hip was performed according to the standard protocol. Multiplanar CT image reconstructions were also generated. COMPARISON:  Left knee radiograph dated 01/31/2018 FINDINGS: Bones/Joint/Cartilage There is no acute fracture or dislocation. Mild arthritic changes of the left hip and SI joint. Small joint effusion noted. Ligaments Suboptimally assessed by CT. Muscles and  Tendons No acute findings.  No large hematoma. Soft tissues There is sigmoid diverticulosis. There is a 3 cm cyst arising from the left ovary which is not well characterized on this CT. Dedicated pelvic ultrasound is recommended on a nonemergent basis for further evaluation. IMPRESSION: 1. No acute fracture or dislocation.  Small left hip effusion. 2. A 3 cm left ovarian cyst. Further evaluation with ultrasound on a nonemergent basis recommended. 3. Sigmoid diverticulosis. Electronically Signed   By: Anner Crete M.D.   On: 01/31/2018 21:31   Dg Hip Unilat W Or Wo Pelvis 2-3 Views Left  Result Date: 01/31/2018 CLINICAL DATA:  Trauma with pain. EXAM: DG HIP (WITH OR WITHOUT PELVIS) 2-3V LEFT COMPARISON:  None. FINDINGS: Limited at the level of the sacrum due to overlapping stool and gas. No evidence of pelvic ring fracture. Both hips are located and intact. Suspension anchors the bilateral pubis. IMPRESSION: No acute finding. Electronically Signed   By: Monte Fantasia M.D.   On: 01/31/2018 18:36    Procedures Procedures (including critical care time)  Medications Ordered in ED Medications  sodium chloride 0.9 % bolus 500 mL (0 mLs Intravenous Stopped 01/31/18 1933)  morphine 4 MG/ML injection 4 mg (4 mg Intravenous Given 01/31/18 1801)  ondansetron (ZOFRAN) injection 4 mg (4 mg Intravenous Given 01/31/18 1801)  acetaminophen (TYLENOL) tablet 1,000 mg (1,000 mg Oral Given 01/31/18 1804)  morphine 4  MG/ML injection 4 mg (4 mg Intravenous Given 01/31/18 1919)     Initial Impression / Assessment and Plan / ED Course  I have reviewed the triage vital signs and the nursing notes.  Pertinent labs & imaging results that were available during my care of the patient were reviewed by me and considered in my medical decision making (see chart for details).     Plain films of left hip and pelvis negative.  CT hip negative as well.  A 3 cm left ovarian cyst was noted.  These findings were discussed with the patient, her friend, and the interpreter.  All questions were answered.  Patient has a walker at home.  Referral to orthopedics.  Discharge medication tramadol.  Final Clinical Impressions(s) / ED Diagnoses   Final diagnoses:  Left hip pain    ED Discharge Orders         Ordered    traMADol (ULTRAM) 50 MG tablet  Every 6 hours PRN     01/31/18 2245           Nat Christen, MD 01/31/18 2305

## 2018-02-01 ENCOUNTER — Emergency Department (HOSPITAL_COMMUNITY)
Admission: EM | Admit: 2018-02-01 | Discharge: 2018-02-01 | Disposition: A | Discharge status: Home / Self Care | Payer: Medicare HMO | Attending: Emergency Medicine | Admitting: Emergency Medicine

## 2018-02-01 ENCOUNTER — Other Ambulatory Visit: Payer: Self-pay

## 2018-02-01 ENCOUNTER — Encounter (HOSPITAL_COMMUNITY): Payer: Self-pay

## 2018-02-01 DIAGNOSIS — Z743 Need for continuous supervision: Secondary | ICD-10-CM | POA: Diagnosis not present

## 2018-02-01 DIAGNOSIS — W19XXXA Unspecified fall, initial encounter: Secondary | ICD-10-CM | POA: Diagnosis not present

## 2018-02-01 DIAGNOSIS — I959 Hypotension, unspecified: Secondary | ICD-10-CM | POA: Diagnosis not present

## 2018-02-01 DIAGNOSIS — Z7984 Long term (current) use of oral hypoglycemic drugs: Secondary | ICD-10-CM | POA: Insufficient documentation

## 2018-02-01 DIAGNOSIS — M25552 Pain in left hip: Secondary | ICD-10-CM | POA: Diagnosis not present

## 2018-02-01 DIAGNOSIS — R52 Pain, unspecified: Secondary | ICD-10-CM | POA: Diagnosis not present

## 2018-02-01 DIAGNOSIS — R279 Unspecified lack of coordination: Secondary | ICD-10-CM | POA: Diagnosis not present

## 2018-02-01 DIAGNOSIS — Z79899 Other long term (current) drug therapy: Secondary | ICD-10-CM | POA: Diagnosis not present

## 2018-02-01 DIAGNOSIS — I10 Essential (primary) hypertension: Secondary | ICD-10-CM | POA: Diagnosis not present

## 2018-02-01 DIAGNOSIS — E119 Type 2 diabetes mellitus without complications: Secondary | ICD-10-CM | POA: Insufficient documentation

## 2018-02-01 DIAGNOSIS — S79912A Unspecified injury of left hip, initial encounter: Secondary | ICD-10-CM | POA: Diagnosis not present

## 2018-02-01 MED ORDER — HYDROMORPHONE HCL 1 MG/ML IJ SOLN
1.0000 mg | Freq: Once | INTRAMUSCULAR | Status: AC
  Administered 2018-02-01: 1 mg via INTRAMUSCULAR
  Filled 2018-02-01: qty 1

## 2018-02-01 MED ORDER — KETOROLAC TROMETHAMINE 15 MG/ML IJ SOLN
15.0000 mg | Freq: Once | INTRAMUSCULAR | Status: AC
  Administered 2018-02-01: 15 mg via INTRAMUSCULAR
  Filled 2018-02-01: qty 1

## 2018-02-01 MED ORDER — MELOXICAM 7.5 MG PO TABS: 7.5000 mg | ORAL_TABLET | Freq: Every day | ORAL | 0 refills | Status: DC

## 2018-02-01 MED ORDER — DEXAMETHASONE 4 MG PO TABS
8.0000 mg | ORAL_TABLET | Freq: Once | ORAL | Status: AC
  Administered 2018-02-01: 8 mg via ORAL
  Filled 2018-02-01: qty 2

## 2018-02-01 MED ORDER — LORAZEPAM 0.5 MG PO TABS
0.5000 mg | ORAL_TABLET | Freq: Once | ORAL | Status: AC
  Administered 2018-02-01: 0.5 mg via ORAL
  Filled 2018-02-01: qty 1

## 2018-02-01 NOTE — ED Notes (Signed)
Pt ambulated with minimal assistance to restroom. Pt used walker without difficulty. MD made aware. Pt has walker and bedside commode at bedside to take home for use. Pt verbalizes understanding of plan of care. Pt in agreeance of plan

## 2018-02-01 NOTE — ED Provider Notes (Signed)
[facility] DEPT Provider Note   CSN: [id number] Arrival date & time: 02/01/18  1356     History   Chief Complaint Chief Complaint  Patient presents with  . Hip Pain    HPI Maria Collier is a 84 y.o. female.  HPI   84 year old female with left groin/hip pain.  She reports onset was Thursday after awkward positioning for a bone scan.  She was seen in the emergency room yesterday for the same complaint.  She had x-rays and a CT scan which were unremarkable.  She has had continued pain.  It hurts at rest.  Sniffily worse with movement.  She states that even with a cane she is having a very hard time walking up.  She was prescribed tramadol yesterday but did not fill the prescription.  She is tried taking Tylenol without much improvement.  Patient is deaf.  Communication was by writing back and forth.  Past Medical History:  Diagnosis Date  . Deafness 1939   following meningitis  . Diabetes mellitus without complication ([place])   . Dyspnea    at night if the house is hot  . Family history of breast cancer   . Family history of colonic polyps   . Family history of ovarian cancer   . GERD (gastroesophageal reflux disease)    occasionally  . Headache    sometimes  . Hyperlipidemia   . Hypertension   . Meningitis   . Osteoporosis   . Pneumonia    yrs. ago    Patient Active Problem List   Diagnosis Date Noted  . Genetic testing 07/02/2017  . Family history of breast cancer   . Family history of ovarian cancer   . Family history of colonic polyps   . Ductal carcinoma in situ (DCIS) of left breast 04/04/2017  . Atypical chest pain 07/21/2014  . Dyspnea 07/21/2014  . Hyperlipidemia   . Hypertension   . Diabetes mellitus without complication [facility])     Past Surgical History:  Procedure Laterality Date  . ABDOMINAL HYSTERECTOMY    . BREAST LUMPECTOMY WITH RADIOACTIVE SEED LOCALIZATION Left 05/15/2017   Procedure: BREAST LUMPECTOMY WITH  RADIOACTIVE SEED LOCALIZATION;  Surgeon: Fanny Skates, MD;  Location: [facility];  Service: General;  Laterality: Left;     OB History   None      Home Medications    Prior to Admission medications   Medication Sig Start Date End Date Taking? Authorizing Provider  acetaminophen (TYLENOL) 500 MG tablet Take 500 mg by mouth daily as needed for mild pain or headache.    [provider]  aspirin 81 MG tablet Take 81 mg by mouth at bedtime.     [provider]  cholecalciferol (VITAMIN D) 1000 units tablet Take 1,000 Units by mouth daily.    [provider]  erythromycin ophthalmic ointment APPLY TO RIGHT LOWER LID TWICE A DAY FOR 2 WEEKS 06/10/17   [provider]  hyaluronate sodium (RADIAPLEXRX) GEL Apply 1 application topically 2 (two) times daily.    [provider]  hydrochlorothiazide (HYDRODIURIL) 25 MG tablet Take 25 mg by mouth daily.    [provider]  LORazepam (ATIVAN) 0.5 MG tablet Take 1 tablet (0.5 mg total) by mouth every 8 (eight) hours as needed for anxiety or sleep. 08/29/17   Gery Pray, MD  metFORMIN (GLUCOPHAGE-XR) 500 MG 24 hr tablet Take 500 mg by mouth every evening.    [provider]  metoprolol  succinate (TOPROL-XL) 100 MG 24 hr tablet Take 100 mg by mouth daily. Take with or immediately following a meal.    [provider]  non-metallic deodorant Jethro Poling) MISC Apply 1 application topically daily as needed.    [provider]  Polyethyl Glycol-Propyl Glycol (SYSTANE OP) Apply 1 drop to eye 4 (four) times daily as needed (dry eyes).    [provider]  simvastatin (ZOCOR) 40 MG tablet Take 40 mg by mouth every evening.     [provider]  traMADol (ULTRAM) 50 MG tablet Take 1 tablet (50 mg total) by mouth every 6 (six) hours as needed. 01/31/18   Nat Christen, MD  vitamin C (ASCORBIC ACID) 500 MG tablet Take 500 mg by mouth daily.    [provider]  vitamin E  (VITAMIN E) 1000 UNIT capsule Take 1,000 Units by mouth daily.    [provider]    Family History Family History  Problem Relation Age of Onset  . Ovarian cancer Daughter        1s  . Breast cancer Daughter 62  . Heart disease Father   . Cancer Paternal Grandmother        ? stomach cancer  . Cancer Paternal Grandfather   . Other Sister        d. 53s, had stomach issues, e.g. ulcers  . Hypertension Brother   . Colon polyps Daughter        6 polyps on first colonoscopy    Social History Social History   Tobacco Use  . Smoking status: Never Smoker  . Smokeless tobacco: Never Used  Substance Use Topics  . Alcohol use: No    Frequency: Never  . Drug use: No     Allergies   Altace [ramipril]; Hyzaar [losartan potassium-hctz]; and Hydrocodone   Review of Systems Review of Systems  All systems reviewed and negative, other than as noted in HPI.  Physical Exam Updated Vital Signs BP (!) 136/44   Pulse 81   Temp 98 F (36.7 C) (Oral)   Resp 18   Wt 57.4 kg   SpO2 96%   BMI 22.78 kg/m   Physical Exam  Constitutional: She appears well-developed and well-nourished. No distress.  HENT:  Head: Normocephalic and atraumatic.  Eyes: Conjunctivae are normal. Right eye exhibits no discharge. Left eye exhibits no discharge.  Neck: Neck supple.  Cardiovascular: Normal rate, regular rhythm and normal heart sounds. Exam reveals no gallop and no friction rub.  No murmur heard. Pulmonary/Chest: Effort normal and breath sounds normal. No respiratory distress.  Abdominal: Soft. She exhibits no distension. There is no tenderness.  Musculoskeletal: She exhibits tenderness. She exhibits no edema.  Tenderness to palpation along the left inguinal crease.  No overlying skin changes.  Pain with attempted range of motion of the hip.  Active range of motion is limited by discomfort.  Neurovascular intact.  Neurological: She is alert.  Skin: Skin is warm and dry.    Psychiatric: She has a normal mood and affect. Her behavior is normal. Thought content normal.  Nursing note and vitals reviewed.    ED Treatments / Results  Labs (all labs ordered are listed, but only abnormal results are displayed) Labs Reviewed - No data to display  EKG None  Radiology Ct Hip Left Wo Contrast  Result Date: 01/31/2018 CLINICAL DATA:  84 year old female with trauma to the left hip. Concern for fracture. EXAM: CT OF THE LEFT HIP WITHOUT CONTRAST TECHNIQUE: Multidetector CT imaging  of the left hip was performed according to the standard protocol. Multiplanar CT image reconstructions were also generated. COMPARISON:  Left knee radiograph dated 01/31/2018 FINDINGS: Bones/Joint/Cartilage There is no acute fracture or dislocation. Mild arthritic changes of the left hip and SI joint. Small joint effusion noted. Ligaments Suboptimally assessed by CT. Muscles and Tendons No acute findings.  No large hematoma. Soft tissues There is sigmoid diverticulosis. There is a 3 cm cyst arising from the left ovary which is not well characterized on this CT. Dedicated pelvic ultrasound is recommended on a nonemergent basis for further evaluation. IMPRESSION: 1. No acute fracture or dislocation.  Small left hip effusion. 2. A 3 cm left ovarian cyst. Further evaluation with ultrasound on a nonemergent basis recommended. 3. Sigmoid diverticulosis. Electronically Signed   By: Anner Crete M.D.   On: 01/31/2018 21:31   Dg Hip Unilat W Or Wo Pelvis 2-3 Views Left  Result Date: 01/31/2018 CLINICAL DATA:  Trauma with pain. EXAM: DG HIP (WITH OR WITHOUT PELVIS) 2-3V LEFT COMPARISON:  None. FINDINGS: Limited at the level of the sacrum due to overlapping stool and gas. No evidence of pelvic ring fracture. Both hips are located and intact. Suspension anchors the bilateral pubis. IMPRESSION: No acute finding. Electronically Signed   By: Monte Fantasia M.D.   On: 01/31/2018 18:36     Procedures Procedures (including critical care time)  Medications Ordered in ED Medications  HYDROmorphone (DILAUDID) injection 1 mg (has no administration in time range)  ketorolac (TORADOL) 15 MG/ML injection 15 mg (has no administration in time range)  dexamethasone (DECADRON) tablet 8 mg (has no administration in time range)  LORazepam (ATIVAN) tablet 0.5 mg (has no administration in time range)     Initial Impression / Assessment and Plan / ED Course  I have reviewed the triage vital signs and the nursing notes.  Pertinent labs & imaging results that were available during my care of the patient were reviewed by me and considered in my medical decision making (see chart for details).     84 year old female with left inguinal/hip pain.  Onset after her walker positioning for a bone scan.  Likely strain.  Negative x-rays and CT yesterday.  She is only been taking Tylenol for the pain.  Plan symptomatic treatment with pain medication anti-inflammatories.  We will get case management involved to see if they can provide any additional resources or assistive aids.  Patient is now feeling much better.  We got her a rolling walker.  She wa able to get up with it and go to the bathroom herself.  She feels considerably more comfortable.  She feels comfortable going home.  Encouraged her to fill her prescription for tramadol.  Given her degree of discomfort, I I think a short course of NSAIDs is fine for couple days as well.  Final Clinical Impressions(s) / ED Diagnoses   Final diagnoses:  Left hip pain    ED Discharge Orders    None       Virgel Manifold, MD 02/01/18 1819

## 2018-02-01 NOTE — ED Triage Notes (Signed)
Pt arrives from home via EMS. Per EMS, pt was seen yesterday for the same. Pt d'ced with no fractures and pain meds. Per EMS pt has been sitting in the same place in her home since DC.Pt unable to bear weight. Pt requires deaf interpreter.

## 2018-02-01 NOTE — ED Notes (Signed)
Per MD: Pt may eat

## 2018-02-01 NOTE — ED Notes (Signed)
Pt refusing Mobile video interpreter multiple times. Attempting to contact in person interpreter

## 2018-02-01 NOTE — ED Notes (Signed)
Bed: WA20 Expected date:  Expected time:  Means of arrival:  Comments: 

## 2018-02-01 NOTE — ED Notes (Signed)
Case manager at bedside 

## 2018-02-01 NOTE — ED Notes (Signed)
[facility] notified need for transport  

## 2018-02-01 NOTE — ED Notes (Signed)
Sign language/deaf interpreter will be here in 20 minutes or less for patient

## 2018-02-01 NOTE — Progress Notes (Addendum)
Received call from ED to assist pt with the D/C plan.   Met with pt and interpreter (pt is deaf). Pt lives alone. She is not able to get up due to L hip pain. She reports a lot of pain. She reports that she went to [facility] ED yesterday with the same issue and she was discharged last night. She was discharged with a prescription for pain, but she was not able to filled the prescription due to her condition and she was not able to find someone to help her filled the prescription.  She reports that she has 2 daughters. One lives in [place] and the other one lives in [place], [place]. Her daughter that lives in [place] is visiting her son in [place] and she won't be back until Monday or Tuesday. She stated that her husband resides at [employer] at [place] facility.  Met with MD. The D/C plan is for pt to return home with [facility] PT//RN/aide. Pt agrees with [facility] services. Discussed [facility] referral and she agreed with agency. Discussed DME. She needs a RW and a 3-in-1 BSC. Contacted [facility] and [facility] for referrals.  Discussed D/C with RN.

## 2018-02-01 NOTE — ED Notes (Signed)
Pt attempting to reach transport time.

## 2018-02-01 NOTE — ED Notes (Signed)
Pt placed on purewick 

## 2018-02-13 ENCOUNTER — Telehealth: Payer: Self-pay

## 2018-02-13 NOTE — Telephone Encounter (Signed)
Called number in patient's chart for Maria Collier, listed as medical interpreter, to remind of SCP visit with NP on 02/18/18 at 10 am.

## 2018-02-20 ENCOUNTER — Inpatient Hospital Stay: Payer: Medicare HMO | Attending: Adult Health | Admitting: Adult Health

## 2018-02-20 ENCOUNTER — Encounter: Payer: Self-pay | Admitting: Adult Health

## 2018-02-20 VITALS — BP 159/54 | HR 74 | Temp 97.8°F | Resp 18 | Ht 62.5 in | Wt 122.9 lb

## 2018-02-20 DIAGNOSIS — Z923 Personal history of irradiation: Secondary | ICD-10-CM

## 2018-02-20 DIAGNOSIS — Z170 Estrogen receptor positive status [ER+]: Secondary | ICD-10-CM | POA: Diagnosis not present

## 2018-02-20 DIAGNOSIS — D0512 Intraductal carcinoma in situ of left breast: Secondary | ICD-10-CM | POA: Diagnosis not present

## 2018-02-20 DIAGNOSIS — Z79899 Other long term (current) drug therapy: Secondary | ICD-10-CM | POA: Insufficient documentation

## 2018-02-20 DIAGNOSIS — N83202 Unspecified ovarian cyst, left side: Secondary | ICD-10-CM | POA: Diagnosis not present

## 2018-02-20 NOTE — Progress Notes (Signed)
CLINIC:  Survivorship   REASON FOR VISIT:  Routine follow-up post-treatment for a recent history of breast cancer.  BRIEF ONCOLOGIC HISTORY:    Ductal carcinoma in situ (DCIS) of left breast   03/27/2017 Initial Diagnosis    Screening detected left breast calcifications 3.3 cm span axilla negative biopsy revealed high-grade DCIS with calcifications ER 0%, PR 0%, Tis N0 stage 0    05/15/2017 Surgery    Left lumpectomy: High-grade DCIS with calcifications and necrosis, no invasive cancer identified, ER 0%, PR 0%, Tis NX stage 0    06/27/2017 - 07/24/2017 Radiation Therapy    Adjuvant radiation: 1) Left breast/ 40.05 Gy in 15 fractions.  2) Left breast boost/ 10 Gy in 5 fractions    07/01/2017 Genetic Testing    CHEK2 c.[id number] VUS identified on the common hereditary cancer panel.  The Hereditary Gene Panel offered by [facility] includes sequencing and/or deletion duplication testing of the following 47 genes: APC, ATM, AXIN2, BARD1, BMPR1A, BRCA1, BRCA2, BRIP1, CDH1, CDK4, CDKN2A (p14ARF), CDKN2A (p16INK4a), CHEK2, CTNNA1, DICER1, EPCAM (Deletion/duplication testing only), GREM1 (promoter region deletion/duplication testing only), KIT, MEN1, MLH1, MSH2, MSH3, MSH6, MUTYH, NBN, NF1, NHTL1, PALB2, PDGFRA, PMS2, POLD1, POLE, PTEN, RAD50, RAD51C, RAD51D, SDHB, SDHC, SDHD, SMAD4, SMARCA4. STK11, TP53, TSC1, TSC2, and VHL.  The following genes were evaluated for sequence changes only: SDHA and HOXB13 c.251G>A variant only. The report date is July 01, 2017.      INTERVAL HISTORY:  Maria Collier presents to the [facility] today for our initial meeting to review her survivorship care plan detailing her treatment course for breast cancer, as well as monitoring long-term side effects of that treatment, education regarding health maintenance, screening, and overall wellness and health promotion.     Overall, Maria Collier reports feeling quite well.  She is accompanied by sign language interpreter, Webb Silversmith.   She has some soreness on the skin since radiation and applies cream as necessary.  She notes her husband passed away recently and she now lives alone.  He passed 02/11/18.  She recently was in the ER and thinks she was told she had cancer in her stomach that was small.  She notes she had three different sign language interpreters and wants me to look at her scans and tell her what is going on.      REVIEW OF SYSTEMS:  Review of Systems  Constitutional: Negative for appetite change, chills, fatigue, fever and unexpected weight change.  HENT:   Negative for hearing loss, lump/mass, sore throat and trouble swallowing.   Eyes: Negative for eye problems and icterus.  Respiratory: Negative for chest tightness, cough and shortness of breath.   Cardiovascular: Negative for chest pain, leg swelling and palpitations.  Gastrointestinal: Negative for abdominal distention, abdominal pain, constipation, diarrhea, nausea and vomiting.  Endocrine: Negative for hot flashes.  Musculoskeletal: Negative for arthralgias.  Skin: Negative for itching and rash.  Neurological: Negative for dizziness, extremity weakness and numbness.  Hematological: Negative for adenopathy. Does not bruise/bleed easily.  Psychiatric/Behavioral: Negative for depression. The patient is not nervous/anxious.   Breast: Denies any new nodularity, masses, tenderness, nipple changes, or nipple discharge.    ONCOLOGY TREATMENT TEAM:  1. Surgeon:  Dr. Dalbert Batman at [facility] 2. Medical Oncologist: Dr. Lindi Adie  3. Radiation Oncologist: Dr. Sondra Come    PAST MEDICAL/SURGICAL HISTORY:  Past Medical History:  Diagnosis Date  . Deafness 1939   following meningitis  . Diabetes mellitus without complication ([place])   . Dyspnea  at night if the house is hot  . Family history of breast cancer   . Family history of colonic polyps   . Family history of ovarian cancer   . GERD (gastroesophageal reflux disease)    occasionally  .  Headache    sometimes  . Hyperlipidemia   . Hypertension   . Meningitis   . Osteoporosis   . Pneumonia    yrs. ago   Past Surgical History:  Procedure Laterality Date  . ABDOMINAL HYSTERECTOMY    . BREAST LUMPECTOMY WITH RADIOACTIVE SEED LOCALIZATION Left 05/15/2017   Procedure: BREAST LUMPECTOMY WITH RADIOACTIVE SEED LOCALIZATION;  Surgeon: Fanny Skates, MD;  Location: [place];  Service: General;  Laterality: Left;     ALLERGIES:  Allergies  Allergen Reactions  . Altace [Ramipril] Cough    COUGH  . Hyzaar [Losartan Potassium-Hctz] Cough    COUGH  . Hydrocodone Cough    COUGH      CURRENT MEDICATIONS:  Outpatient Encounter Medications as of 02/20/2018  Medication Sig  . acetaminophen (TYLENOL) 500 MG tablet Take 500 mg by mouth daily as needed for mild pain or headache.  Marland Kitchen aspirin 81 MG tablet Take 81 mg by mouth at bedtime.   . cholecalciferol (VITAMIN D) 1000 units tablet Take 1,000 Units by mouth daily.  Marland Kitchen erythromycin ophthalmic ointment APPLY TO RIGHT LOWER LID TWICE A DAY FOR 2 WEEKS  . hyaluronate sodium (RADIAPLEXRX) GEL Apply 1 application topically 2 (two) times daily.  . hydrochlorothiazide (HYDRODIURIL) 25 MG tablet Take 25 mg by mouth daily.  Marland Kitchen LORazepam (ATIVAN) 0.5 MG tablet Take 1 tablet (0.5 mg total) by mouth every 8 (eight) hours as needed for anxiety or sleep.  . meloxicam (MOBIC) 7.5 MG tablet Take 1 tablet (7.5 mg total) by mouth daily.  . metFORMIN (GLUCOPHAGE-XR) 500 MG 24 hr tablet Take 500 mg by mouth every evening.  . metoprolol succinate (TOPROL-XL) 100 MG 24 hr tablet Take 100 mg by mouth daily. Take with or immediately following a meal.  . non-metallic deodorant (ALRA) MISC Apply 1 application topically daily as needed.  Vladimir Faster Glycol-Propyl Glycol (SYSTANE OP) Apply 1 drop to eye 4 (four) times daily as needed (dry eyes).  . simvastatin (ZOCOR) 40 MG tablet Take 40 mg by mouth every evening.   . traMADol (ULTRAM) 50 MG tablet Take 1  tablet (50 mg total) by mouth every 6 (six) hours as needed.  . vitamin C (ASCORBIC ACID) 500 MG tablet Take 500 mg by mouth daily.  . vitamin E (VITAMIN E) 1000 UNIT capsule Take 1,000 Units by mouth daily.   No facility-administered encounter medications on file as of 02/20/2018.      ONCOLOGIC FAMILY HISTORY:  Family History  Problem Relation Age of Onset  . Ovarian cancer Daughter        22s  . Breast cancer Daughter 58  . Heart disease Father   . Cancer Paternal Grandmother        ? stomach cancer  . Cancer Paternal Grandfather   . Other Sister        d. 69s, had stomach issues, e.g. ulcers  . Hypertension Brother   . Colon polyps Daughter        8 polyps on first colonoscopy     GENETIC COUNSELING/TESTING: See above  SOCIAL HISTORY:  Social History   Socioeconomic History  . Marital status: Married    Spouse name: Not on file  . Number of children: Not on file  .  Years of education: Not on file  . Highest education level: Not on file  Occupational History  . Not on file  Social Needs  . Financial resource strain: Not on file  . Food insecurity:    Worry: Not on file    Inability: Not on file  . Transportation needs:    Medical: Not on file    Non-medical: Not on file  Tobacco Use  . Smoking status: Never Smoker  . Smokeless tobacco: Never Used  Substance and Sexual Activity  . Alcohol use: No    Frequency: Never  . Drug use: No  . Sexual activity: Not on file  Lifestyle  . Physical activity:    Days per week: Not on file    Minutes per session: Not on file  . Stress: Not on file  Relationships  . Social connections:    Talks on phone: Not on file    Gets together: Not on file    Attends religious service: Not on file    Active member of club or organization: Not on file    Attends meetings of clubs or organizations: Not on file    Relationship status: Not on file  . Intimate partner violence:    Fear of current or ex partner: Not on file     Emotionally abused: Not on file    Physically abused: Not on file    Forced sexual activity: Not on file  Other Topics Concern  . Not on file  Social History Narrative  . Not on file        PHYSICAL EXAMINATION:  Vital Signs:   Vitals:   02/20/18 1031  BP: (!) 159/54  Pulse: 74  Resp: 18  Temp: 97.8 F (36.6 C)  SpO2: 100%   Filed Weights   02/20/18 1031  Weight: 122 lb 14.4 oz (55.7 kg)   General: Well-nourished, well-appearing female in no acute distress.  She is accompanied in clinic by her sign language interpreter today.   HEENT: Head is normocephalic.  Pupils equal and reactive to light. Conjunctivae clear without exudate.  Sclerae anicteric. Oral mucosa is pink, moist.  Oropharynx is pink without lesions or erythema.  Lymph: No cervical, supraclavicular, or infraclavicular lymphadenopathy noted on palpation.  Cardiovascular: Regular rate and rhythm.Marland Kitchen Respiratory: Clear to auscultation bilaterally. Chest expansion symmetric; breathing non-labored.  Breasts: left breast s/p lumpectomy and radiation, no sign of recurrence, right breast benign GI: Abdomen soft and round; non-tender, non-distended. Bowel sounds normoactive.  GU: Deferred.  Neuro: No focal deficits. Steady gait.  Psych: Mood and affect normal and appropriate for situation.  Extremities: No edema. MSK: No focal spinal tenderness to palpation.  Full range of motion in bilateral upper extremities Skin: Warm and dry.  LABORATORY DATA:  None for this visit.  DIAGNOSTIC IMAGING:  None for this visit.      ASSESSMENT AND PLAN:  Ms.. Collier is a pleasant 84 y.o. female with Stage 0 left breast DCIS, ER+/PR+/HER2-, diagnosed in 03/2017, treated with lumpectomy, adjuvant radiation therapy.  She presents to the Survivorship Clinic for our initial meeting and routine follow-up post-completion of treatment for breast cancer.    1. Stage 0 left breast cancer:  Maria Collier is continuing to recover from definitive  treatment for breast cancer. She will follow-up with her surgeon, Dr. Dalbert Batman, as her DCIS was ER and PR negative.  I reviewed recommendations for clinical breast exams every 6 months for 3-5 years. Reviewed with Dr. Lindi Adie in detail.  Today, a comprehensive survivorship care plan and treatment summary was reviewed with the patient today detailing her breast cancer diagnosis, treatment course, potential late/long-term effects of treatment, appropriate follow-up care with recommendations for the future, and patient education resources.  A copy of this summary, along with a letter will be sent to the patient's primary care provider via mail/fax/In Basket message after today's visit.    2. Left ovarian cyst: I told the patient that per the CT scan, there appeared to be an ovarian cyst.  It is not typical for post menopausal women to get an ovarian cyst.  I recommended she f/u with Dr. Laurann Montana about this and discuss the possibility and need for transvaginal ultrasound.  3. Bone health:  Given Maria Collier's age/history of breast cancer, she is at risk for bone demineralization.  Will defer to her PCP regarding bone density testing and management.  She was given education on specific activities to promote bone health.  4. Cancer screening:  Due to Maria Collier's history and her age, she should receive screening for skin cancers, colon cancer, and gynecologic cancers.  The information and recommendations are listed on the patient's comprehensive care plan/treatment summary and were reviewed in detail with the patient.    5. Health maintenance and wellness promotion: Maria Collier was encouraged to consume 5-7 servings of fruits and vegetables per day. We reviewed the "Nutrition Rainbow" handout, as well as the handout "Take Control of Your Health and Reduce Your Cancer Risk" from the [employer].  She was also encouraged to engage in moderate to vigorous exercise for 30 minutes per day most days of the week. We  discussed the [employer] fitness program, which is designed for cancer survivors to help them become more physically fit after cancer treatments.  She was instructed to limit her alcohol consumption and continue to abstain from tobacco use.     6. Support services/counseling: It is not uncommon for this period of the patient's cancer care trajectory to be one of many emotions and stressors.  We discussed an opportunity for her to participate in the next session of [facility] ("Finding Your New Normal") support group series designed for patients after they have completed treatment.   Maria Collier was encouraged to take advantage of our many other support services programs, support groups, and/or counseling in coping with her new life as a cancer survivor after completing anti-cancer treatment.  She was offered support today through active listening and expressive supportive counseling.  She was given information regarding our available services and encouraged to contact me with any questions or for help enrolling in any of our support group/programs.    Dispo:   -Mammogram due in 03/2018, orders placed today -Follow up with surgery in 6 months -She is welcome to return back to the Survivorship Clinic at any time; no additional follow-up needed at this time.  -Consider referral back to survivorship as a long-term survivor for continued surveillance  A total of (30) minutes of face-to-face time was spent with this patient with greater than 50% of that time in counseling and care-coordination.   Gardenia Phlegm, [place] [phone]   Note: PRIMARY CARE PROVIDER Kelton Pillar, [place] [phone]

## 2018-03-03 ENCOUNTER — Ambulatory Visit: Admission: RE | Admit: 2018-03-03 | Payer: Medicare HMO | Source: Ambulatory Visit | Admitting: Radiation Oncology

## 2018-03-20 DIAGNOSIS — Z09 Encounter for follow-up examination after completed treatment for conditions other than malignant neoplasm: Secondary | ICD-10-CM | POA: Diagnosis not present

## 2018-03-20 DIAGNOSIS — R921 Mammographic calcification found on diagnostic imaging of breast: Secondary | ICD-10-CM | POA: Diagnosis not present

## 2018-04-24 DIAGNOSIS — M1990 Unspecified osteoarthritis, unspecified site: Secondary | ICD-10-CM | POA: Diagnosis not present

## 2018-04-24 DIAGNOSIS — E785 Hyperlipidemia, unspecified: Secondary | ICD-10-CM | POA: Diagnosis not present

## 2018-04-24 DIAGNOSIS — I10 Essential (primary) hypertension: Secondary | ICD-10-CM | POA: Diagnosis not present

## 2018-04-24 DIAGNOSIS — R32 Unspecified urinary incontinence: Secondary | ICD-10-CM | POA: Diagnosis not present

## 2018-04-24 DIAGNOSIS — E119 Type 2 diabetes mellitus without complications: Secondary | ICD-10-CM | POA: Diagnosis not present

## 2018-04-24 DIAGNOSIS — Z7982 Long term (current) use of aspirin: Secondary | ICD-10-CM | POA: Diagnosis not present

## 2018-04-24 DIAGNOSIS — Z7984 Long term (current) use of oral hypoglycemic drugs: Secondary | ICD-10-CM | POA: Diagnosis not present

## 2018-04-24 DIAGNOSIS — G8929 Other chronic pain: Secondary | ICD-10-CM | POA: Diagnosis not present

## 2018-04-24 DIAGNOSIS — Z803 Family history of malignant neoplasm of breast: Secondary | ICD-10-CM | POA: Diagnosis not present

## 2018-04-24 DIAGNOSIS — Z809 Family history of malignant neoplasm, unspecified: Secondary | ICD-10-CM | POA: Diagnosis not present

## 2018-05-01 DIAGNOSIS — E1121 Type 2 diabetes mellitus with diabetic nephropathy: Secondary | ICD-10-CM | POA: Diagnosis not present

## 2018-05-01 DIAGNOSIS — E559 Vitamin D deficiency, unspecified: Secondary | ICD-10-CM | POA: Diagnosis not present

## 2018-05-01 DIAGNOSIS — E7800 Pure hypercholesterolemia, unspecified: Secondary | ICD-10-CM | POA: Diagnosis not present

## 2018-05-01 DIAGNOSIS — Z7984 Long term (current) use of oral hypoglycemic drugs: Secondary | ICD-10-CM | POA: Diagnosis not present

## 2018-05-21 ENCOUNTER — Other Ambulatory Visit: Payer: Self-pay | Admitting: Family Medicine

## 2018-05-21 ENCOUNTER — Other Ambulatory Visit: Payer: Self-pay | Admitting: *Deleted

## 2018-05-21 DIAGNOSIS — N83202 Unspecified ovarian cyst, left side: Secondary | ICD-10-CM

## 2018-05-21 DIAGNOSIS — E1121 Type 2 diabetes mellitus with diabetic nephropathy: Secondary | ICD-10-CM | POA: Diagnosis not present

## 2018-05-21 DIAGNOSIS — N183 Chronic kidney disease, stage 3 (moderate): Secondary | ICD-10-CM | POA: Diagnosis not present

## 2018-05-21 DIAGNOSIS — N83209 Unspecified ovarian cyst, unspecified side: Secondary | ICD-10-CM | POA: Diagnosis not present

## 2018-05-21 DIAGNOSIS — D0510 Intraductal carcinoma in situ of unspecified breast: Secondary | ICD-10-CM | POA: Diagnosis not present

## 2018-05-21 DIAGNOSIS — I129 Hypertensive chronic kidney disease with stage 1 through stage 4 chronic kidney disease, or unspecified chronic kidney disease: Secondary | ICD-10-CM | POA: Diagnosis not present

## 2018-05-21 DIAGNOSIS — E7800 Pure hypercholesterolemia, unspecified: Secondary | ICD-10-CM | POA: Diagnosis not present

## 2018-06-02 DIAGNOSIS — E119 Type 2 diabetes mellitus without complications: Secondary | ICD-10-CM | POA: Diagnosis not present

## 2018-06-02 DIAGNOSIS — H33311 Horseshoe tear of retina without detachment, right eye: Secondary | ICD-10-CM | POA: Diagnosis not present

## 2018-06-02 DIAGNOSIS — H3122 Choroidal dystrophy (central areolar) (generalized) (peripapillary): Secondary | ICD-10-CM | POA: Diagnosis not present

## 2018-06-02 DIAGNOSIS — Z961 Presence of intraocular lens: Secondary | ICD-10-CM | POA: Diagnosis not present

## 2018-06-02 DIAGNOSIS — H35371 Puckering of macula, right eye: Secondary | ICD-10-CM | POA: Diagnosis not present

## 2018-06-17 DIAGNOSIS — E119 Type 2 diabetes mellitus without complications: Secondary | ICD-10-CM | POA: Diagnosis not present

## 2018-06-17 DIAGNOSIS — H524 Presbyopia: Secondary | ICD-10-CM | POA: Diagnosis not present

## 2018-06-17 DIAGNOSIS — Z961 Presence of intraocular lens: Secondary | ICD-10-CM | POA: Diagnosis not present

## 2018-07-02 ENCOUNTER — Other Ambulatory Visit: Payer: Medicare HMO

## 2018-08-13 ENCOUNTER — Other Ambulatory Visit: Payer: Medicare HMO

## 2018-10-23 ENCOUNTER — Other Ambulatory Visit: Payer: Self-pay | Admitting: Family Medicine

## 2018-10-23 DIAGNOSIS — N83202 Unspecified ovarian cyst, left side: Secondary | ICD-10-CM

## 2018-11-12 DIAGNOSIS — E1121 Type 2 diabetes mellitus with diabetic nephropathy: Secondary | ICD-10-CM | POA: Diagnosis not present

## 2018-11-24 DIAGNOSIS — L821 Other seborrheic keratosis: Secondary | ICD-10-CM | POA: Diagnosis not present

## 2018-11-24 DIAGNOSIS — L814 Other melanin hyperpigmentation: Secondary | ICD-10-CM | POA: Diagnosis not present

## 2018-11-24 DIAGNOSIS — L570 Actinic keratosis: Secondary | ICD-10-CM | POA: Diagnosis not present

## 2018-11-24 DIAGNOSIS — L820 Inflamed seborrheic keratosis: Secondary | ICD-10-CM | POA: Diagnosis not present

## 2018-11-24 DIAGNOSIS — D225 Melanocytic nevi of trunk: Secondary | ICD-10-CM | POA: Diagnosis not present

## 2018-12-04 DIAGNOSIS — Z8041 Family history of malignant neoplasm of ovary: Secondary | ICD-10-CM | POA: Diagnosis not present

## 2018-12-04 DIAGNOSIS — Z789 Other specified health status: Secondary | ICD-10-CM | POA: Diagnosis not present

## 2018-12-04 DIAGNOSIS — E785 Hyperlipidemia, unspecified: Secondary | ICD-10-CM | POA: Diagnosis not present

## 2018-12-04 DIAGNOSIS — I10 Essential (primary) hypertension: Secondary | ICD-10-CM | POA: Diagnosis not present

## 2018-12-04 DIAGNOSIS — E119 Type 2 diabetes mellitus without complications: Secondary | ICD-10-CM | POA: Diagnosis not present

## 2018-12-04 DIAGNOSIS — C50412 Malignant neoplasm of upper-outer quadrant of left female breast: Secondary | ICD-10-CM | POA: Diagnosis not present

## 2018-12-22 DIAGNOSIS — R69 Illness, unspecified: Secondary | ICD-10-CM | POA: Diagnosis not present

## 2018-12-31 DIAGNOSIS — L820 Inflamed seborrheic keratosis: Secondary | ICD-10-CM | POA: Diagnosis not present

## 2018-12-31 DIAGNOSIS — L570 Actinic keratosis: Secondary | ICD-10-CM | POA: Diagnosis not present

## 2019-03-13 DIAGNOSIS — R69 Illness, unspecified: Secondary | ICD-10-CM | POA: Diagnosis not present

## 2019-03-13 DIAGNOSIS — N183 Chronic kidney disease, stage 3 unspecified: Secondary | ICD-10-CM | POA: Diagnosis not present

## 2019-03-13 DIAGNOSIS — E1121 Type 2 diabetes mellitus with diabetic nephropathy: Secondary | ICD-10-CM | POA: Diagnosis not present

## 2019-03-13 DIAGNOSIS — D0510 Intraductal carcinoma in situ of unspecified breast: Secondary | ICD-10-CM | POA: Diagnosis not present

## 2019-03-13 DIAGNOSIS — E7800 Pure hypercholesterolemia, unspecified: Secondary | ICD-10-CM | POA: Diagnosis not present

## 2019-03-13 DIAGNOSIS — I129 Hypertensive chronic kidney disease with stage 1 through stage 4 chronic kidney disease, or unspecified chronic kidney disease: Secondary | ICD-10-CM | POA: Diagnosis not present

## 2019-03-13 DIAGNOSIS — M1990 Unspecified osteoarthritis, unspecified site: Secondary | ICD-10-CM | POA: Diagnosis not present

## 2019-03-13 DIAGNOSIS — E559 Vitamin D deficiency, unspecified: Secondary | ICD-10-CM | POA: Diagnosis not present

## 2019-03-13 DIAGNOSIS — Z0000 Encounter for general adult medical examination without abnormal findings: Secondary | ICD-10-CM | POA: Diagnosis not present

## 2019-03-13 DIAGNOSIS — K219 Gastro-esophageal reflux disease without esophagitis: Secondary | ICD-10-CM | POA: Diagnosis not present

## 2019-03-16 ENCOUNTER — Other Ambulatory Visit: Payer: Self-pay | Admitting: General Surgery

## 2019-03-16 DIAGNOSIS — Z853 Personal history of malignant neoplasm of breast: Secondary | ICD-10-CM

## 2019-03-26 DIAGNOSIS — Z9889 Other specified postprocedural states: Secondary | ICD-10-CM | POA: Diagnosis not present

## 2019-05-11 IMAGING — CR DG HIP (WITH OR WITHOUT PELVIS) 2-3V*L*
3 series · 3 of 3 positions shown · non-contrast
Comparison: None.

CLINICAL DATA: Trauma with pain.

EXAM:
DG HIP (WITH OR WITHOUT PELVIS) 2-3V LEFT

[pelvis ap]
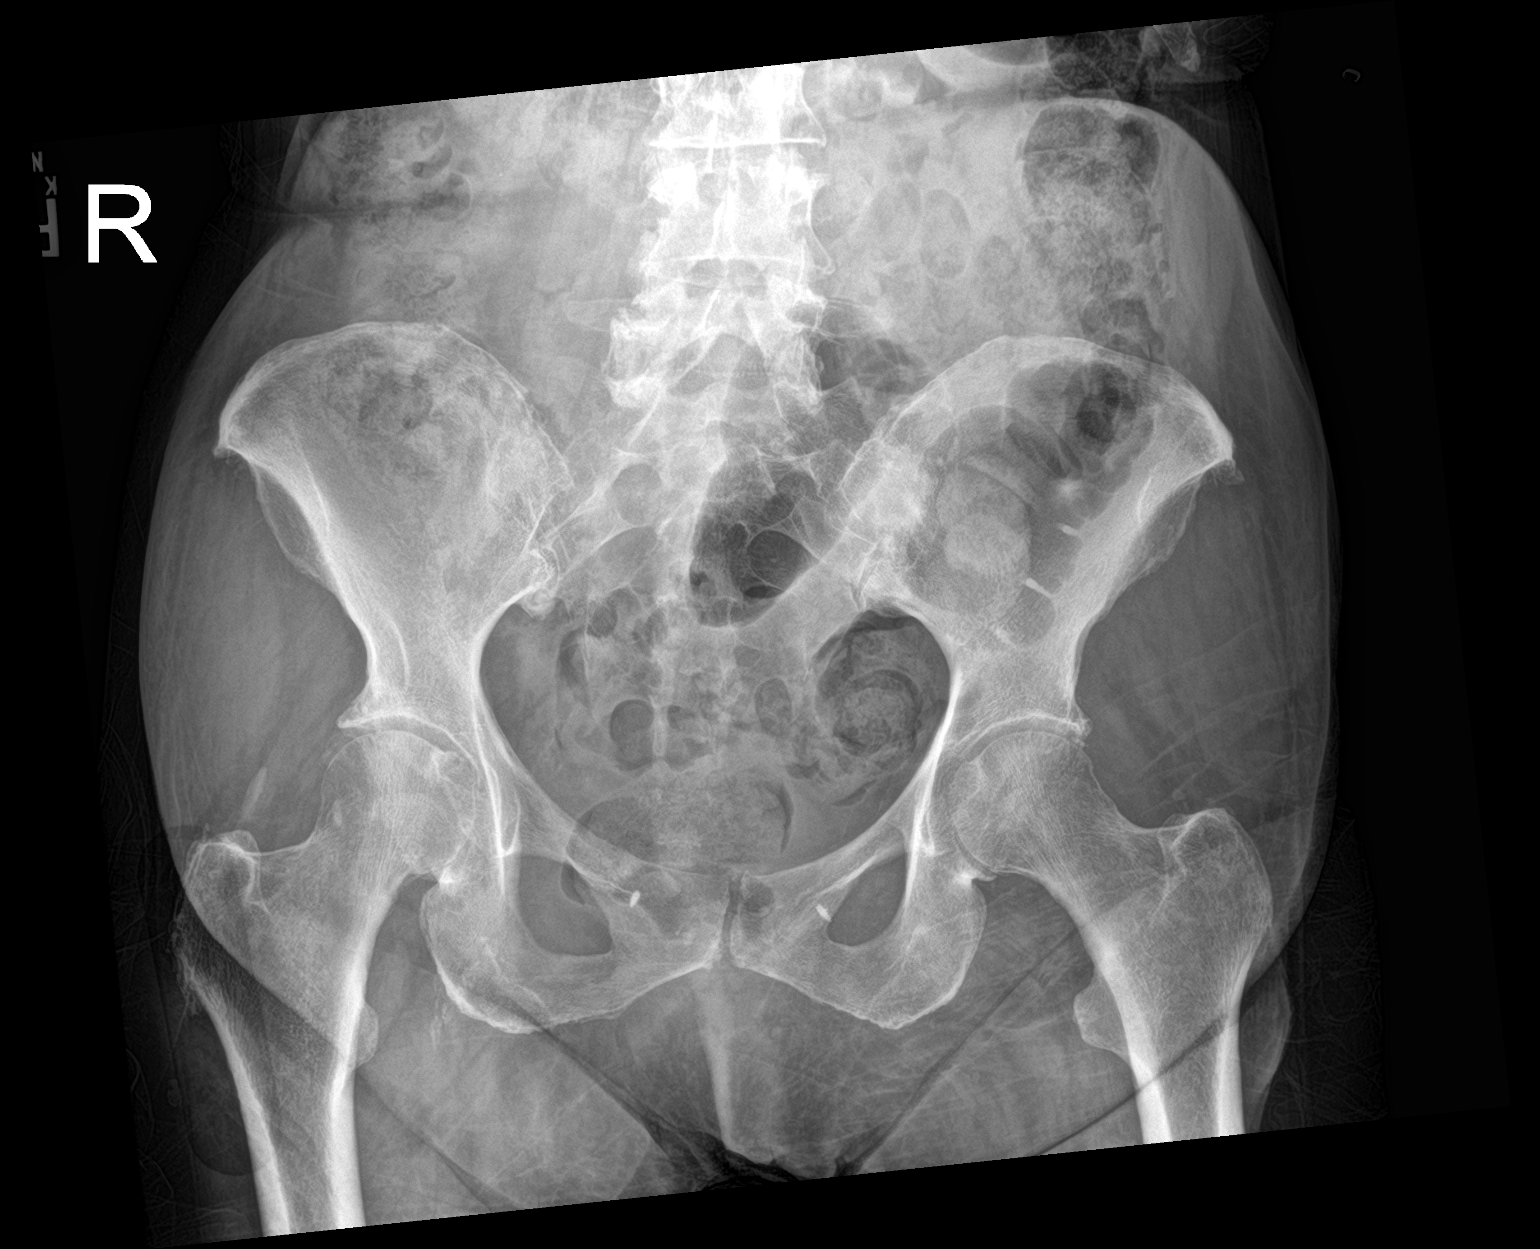

[hip ap]
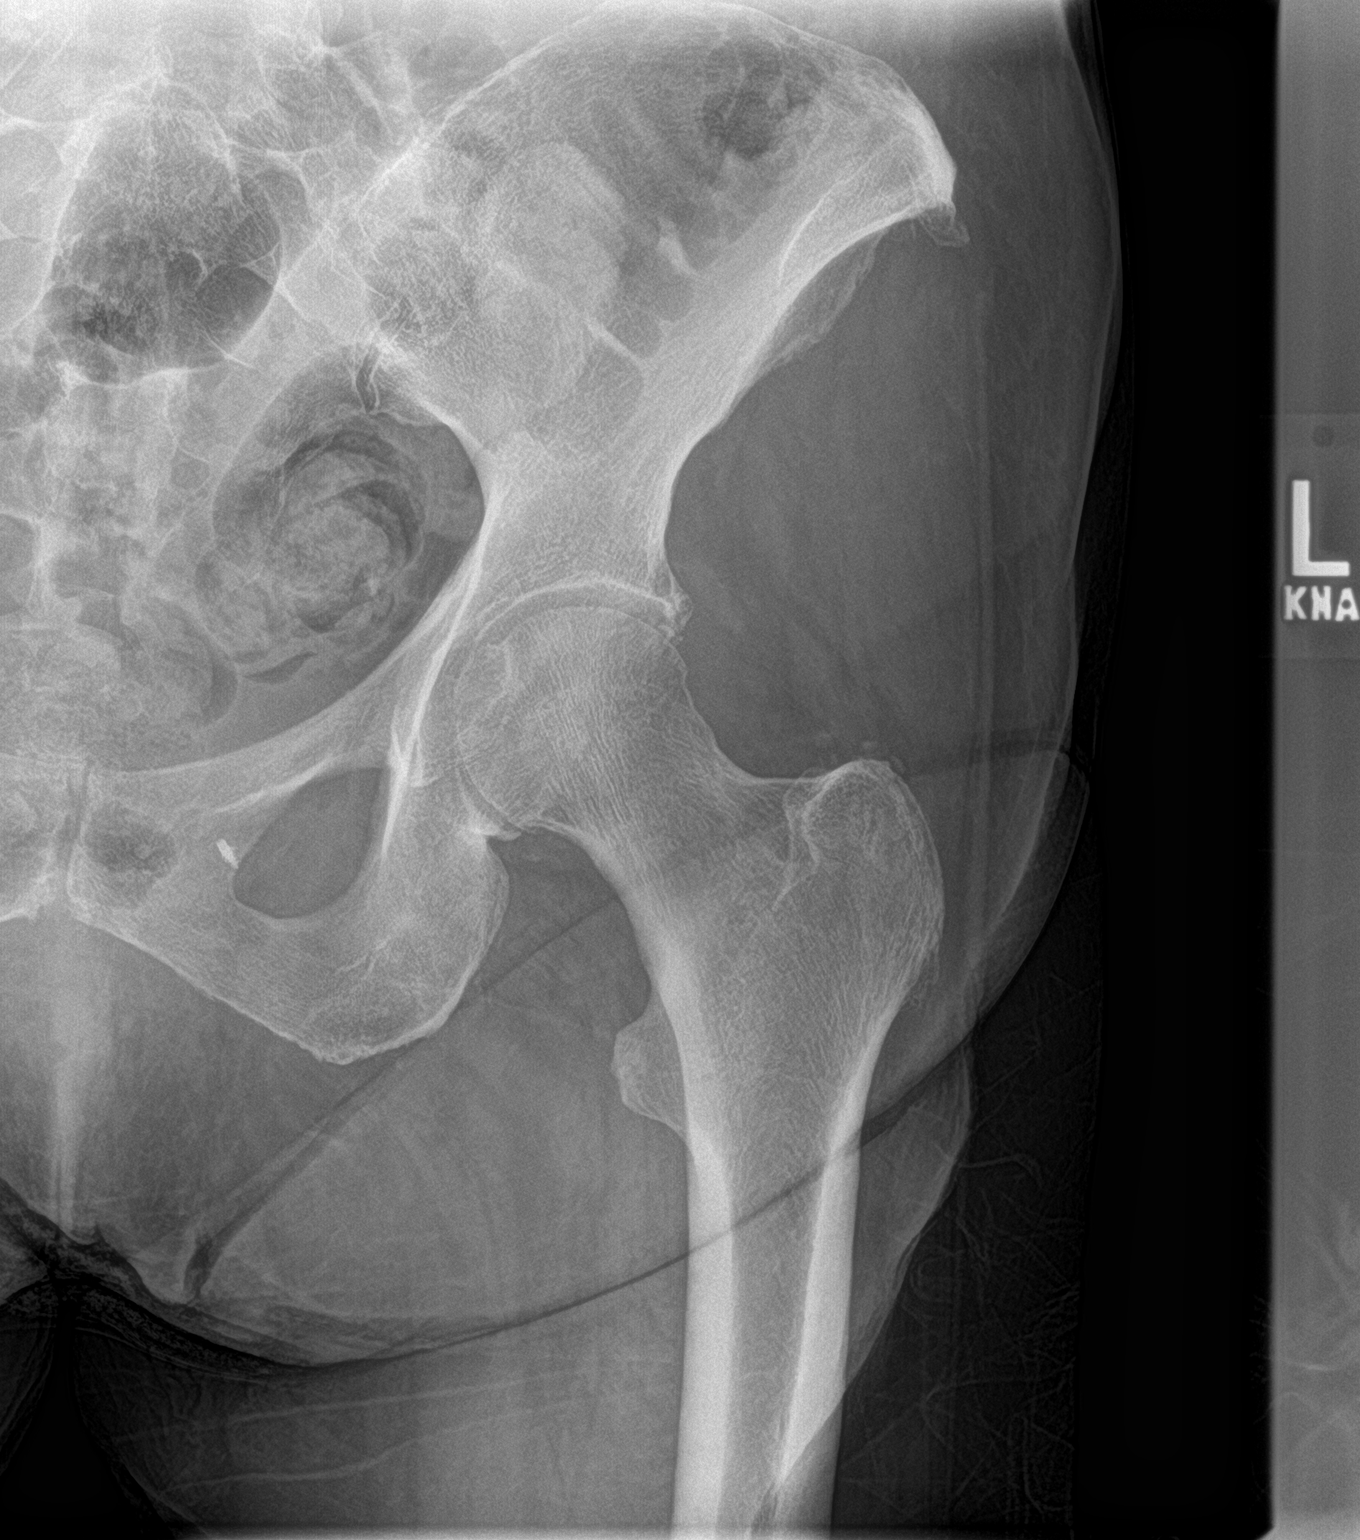

[hip lat]
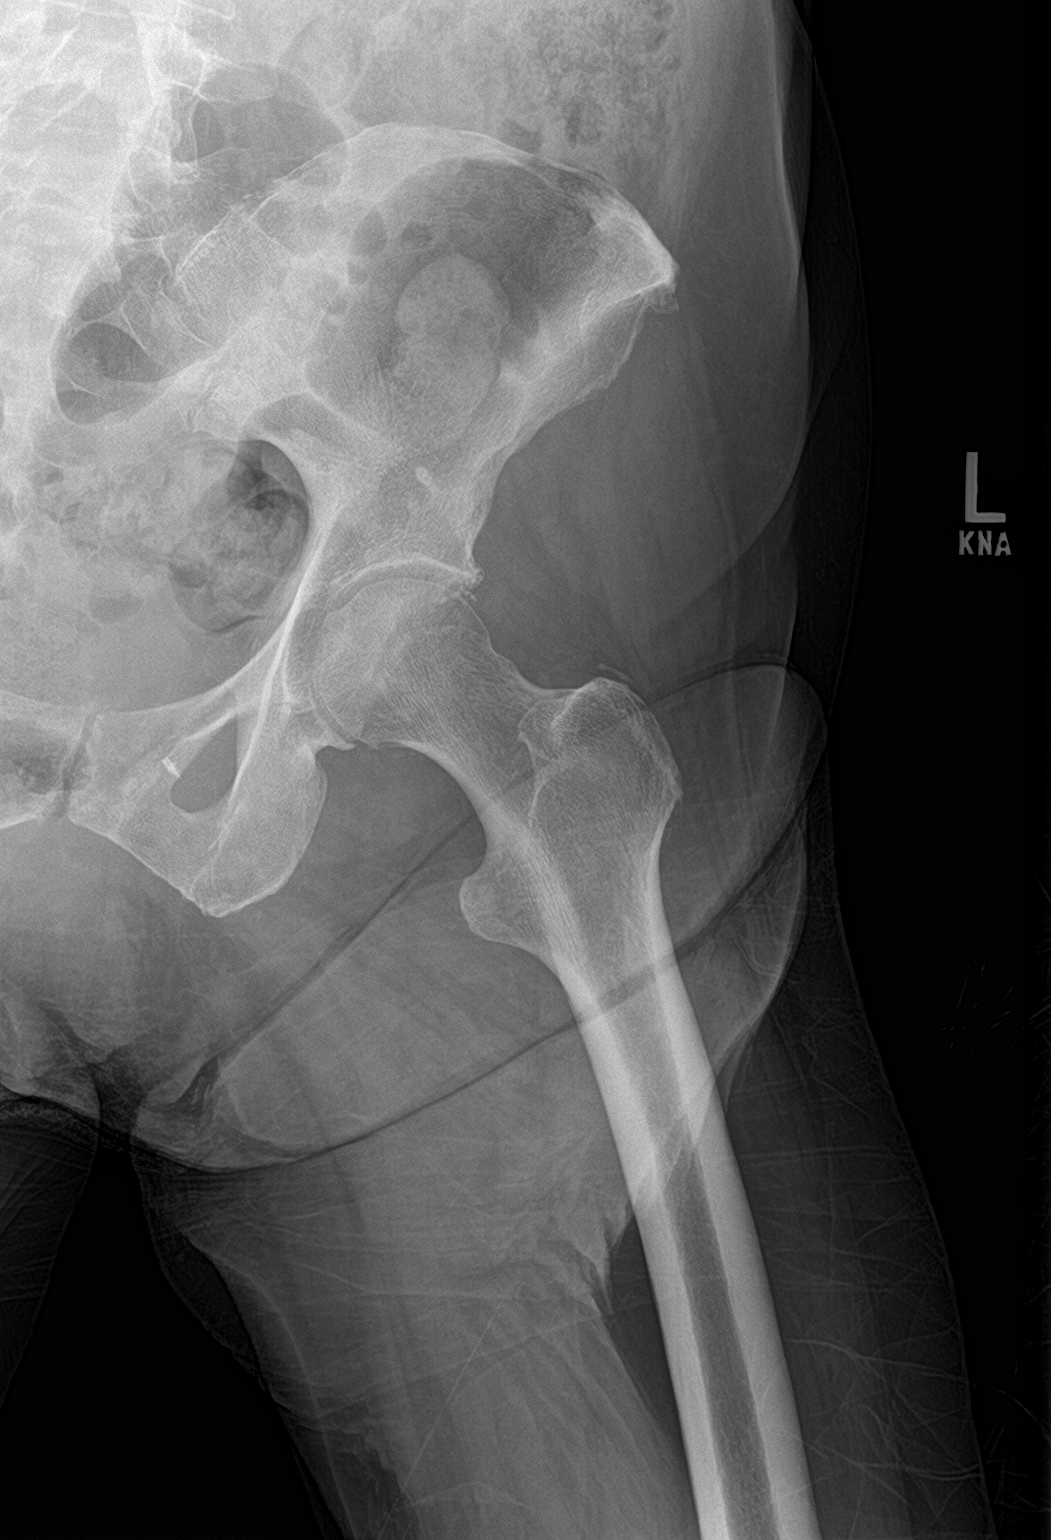

[3 of 3 positions shown; findings below may reference images not displayed]

FINDINGS: Limited at the level of the sacrum due to overlapping stool and gas.
No evidence of pelvic ring fracture. Both hips are located and
intact.

Suspension anchors the bilateral pubis.
IMPRESSION: No acute finding.

## 2019-06-01 DIAGNOSIS — H35372 Puckering of macula, left eye: Secondary | ICD-10-CM | POA: Diagnosis not present

## 2019-06-01 DIAGNOSIS — E119 Type 2 diabetes mellitus without complications: Secondary | ICD-10-CM | POA: Diagnosis not present

## 2019-06-01 DIAGNOSIS — H35371 Puckering of macula, right eye: Secondary | ICD-10-CM | POA: Diagnosis not present

## 2019-06-01 DIAGNOSIS — H33311 Horseshoe tear of retina without detachment, right eye: Secondary | ICD-10-CM | POA: Diagnosis not present

## 2019-06-24 DIAGNOSIS — H04123 Dry eye syndrome of bilateral lacrimal glands: Secondary | ICD-10-CM | POA: Diagnosis not present

## 2019-06-24 DIAGNOSIS — Z961 Presence of intraocular lens: Secondary | ICD-10-CM | POA: Diagnosis not present

## 2019-07-27 DIAGNOSIS — E1121 Type 2 diabetes mellitus with diabetic nephropathy: Secondary | ICD-10-CM | POA: Diagnosis not present

## 2019-10-06 DIAGNOSIS — R600 Localized edema: Secondary | ICD-10-CM | POA: Diagnosis not present

## 2019-12-02 DIAGNOSIS — M170 Bilateral primary osteoarthritis of knee: Secondary | ICD-10-CM | POA: Diagnosis not present

## 2019-12-07 DIAGNOSIS — R69 Illness, unspecified: Secondary | ICD-10-CM | POA: Diagnosis not present

## 2020-01-29 DIAGNOSIS — C50412 Malignant neoplasm of upper-outer quadrant of left female breast: Secondary | ICD-10-CM | POA: Diagnosis not present

## 2020-02-01 DIAGNOSIS — M170 Bilateral primary osteoarthritis of knee: Secondary | ICD-10-CM | POA: Diagnosis not present

## 2020-02-01 DIAGNOSIS — E1121 Type 2 diabetes mellitus with diabetic nephropathy: Secondary | ICD-10-CM | POA: Diagnosis not present

## 2020-03-29 DIAGNOSIS — Z853 Personal history of malignant neoplasm of breast: Secondary | ICD-10-CM | POA: Diagnosis not present

## 2020-05-30 DIAGNOSIS — H33311 Horseshoe tear of retina without detachment, right eye: Secondary | ICD-10-CM | POA: Diagnosis not present

## 2020-05-30 DIAGNOSIS — E119 Type 2 diabetes mellitus without complications: Secondary | ICD-10-CM | POA: Diagnosis not present

## 2020-05-30 DIAGNOSIS — H35373 Puckering of macula, bilateral: Secondary | ICD-10-CM | POA: Diagnosis not present

## 2020-05-31 DIAGNOSIS — E7800 Pure hypercholesterolemia, unspecified: Secondary | ICD-10-CM | POA: Diagnosis not present

## 2020-05-31 DIAGNOSIS — E1140 Type 2 diabetes mellitus with diabetic neuropathy, unspecified: Secondary | ICD-10-CM | POA: Diagnosis not present

## 2020-05-31 DIAGNOSIS — N183 Chronic kidney disease, stage 3 unspecified: Secondary | ICD-10-CM | POA: Diagnosis not present

## 2020-05-31 DIAGNOSIS — D0510 Intraductal carcinoma in situ of unspecified breast: Secondary | ICD-10-CM | POA: Diagnosis not present

## 2020-05-31 DIAGNOSIS — I499 Cardiac arrhythmia, unspecified: Secondary | ICD-10-CM | POA: Diagnosis not present

## 2020-05-31 DIAGNOSIS — I129 Hypertensive chronic kidney disease with stage 1 through stage 4 chronic kidney disease, or unspecified chronic kidney disease: Secondary | ICD-10-CM | POA: Diagnosis not present

## 2020-05-31 DIAGNOSIS — M1990 Unspecified osteoarthritis, unspecified site: Secondary | ICD-10-CM | POA: Diagnosis not present

## 2020-05-31 DIAGNOSIS — E1121 Type 2 diabetes mellitus with diabetic nephropathy: Secondary | ICD-10-CM | POA: Diagnosis not present

## 2020-06-28 DIAGNOSIS — H52203 Unspecified astigmatism, bilateral: Secondary | ICD-10-CM | POA: Diagnosis not present

## 2020-06-28 DIAGNOSIS — Z961 Presence of intraocular lens: Secondary | ICD-10-CM | POA: Diagnosis not present

## 2020-06-28 DIAGNOSIS — E119 Type 2 diabetes mellitus without complications: Secondary | ICD-10-CM | POA: Diagnosis not present

## 2020-08-09 ENCOUNTER — Other Ambulatory Visit: Payer: Self-pay | Admitting: Family Medicine

## 2020-08-09 DIAGNOSIS — N83202 Unspecified ovarian cyst, left side: Secondary | ICD-10-CM

## 2020-09-16 ENCOUNTER — Other Ambulatory Visit: Payer: Medicare HMO

## 2020-09-19 ENCOUNTER — Other Ambulatory Visit: Payer: Medicare HMO

## 2020-09-19 DIAGNOSIS — R0789 Other chest pain: Secondary | ICD-10-CM | POA: Diagnosis not present

## 2020-09-19 DIAGNOSIS — I493 Ventricular premature depolarization: Secondary | ICD-10-CM | POA: Diagnosis not present

## 2020-09-19 DIAGNOSIS — R002 Palpitations: Secondary | ICD-10-CM | POA: Diagnosis not present

## 2020-09-19 DIAGNOSIS — R059 Cough, unspecified: Secondary | ICD-10-CM | POA: Diagnosis not present

## 2020-09-19 DIAGNOSIS — Z6824 Body mass index (BMI) 24.0-24.9, adult: Secondary | ICD-10-CM | POA: Diagnosis not present

## 2020-09-19 DIAGNOSIS — Z20822 Contact with and (suspected) exposure to covid-19: Secondary | ICD-10-CM | POA: Diagnosis not present

## 2020-10-07 DIAGNOSIS — I493 Ventricular premature depolarization: Secondary | ICD-10-CM | POA: Diagnosis not present

## 2020-10-07 DIAGNOSIS — R0789 Other chest pain: Secondary | ICD-10-CM | POA: Diagnosis not present

## 2020-10-07 DIAGNOSIS — I10 Essential (primary) hypertension: Secondary | ICD-10-CM | POA: Diagnosis not present

## 2020-10-07 DIAGNOSIS — E7849 Other hyperlipidemia: Secondary | ICD-10-CM | POA: Diagnosis not present

## 2020-10-07 DIAGNOSIS — R0602 Shortness of breath: Secondary | ICD-10-CM | POA: Diagnosis not present

## 2020-10-07 DIAGNOSIS — R002 Palpitations: Secondary | ICD-10-CM | POA: Diagnosis not present

## 2020-11-15 DIAGNOSIS — E1121 Type 2 diabetes mellitus with diabetic nephropathy: Secondary | ICD-10-CM | POA: Diagnosis not present

## 2020-12-26 ENCOUNTER — Ambulatory Visit: Payer: Medicare HMO | Admitting: Cardiology

## 2020-12-26 ENCOUNTER — Other Ambulatory Visit: Payer: Self-pay

## 2020-12-26 ENCOUNTER — Encounter: Payer: Self-pay | Admitting: Cardiology

## 2020-12-26 VITALS — BP 128/66 | HR 70 | Ht 62.0 in | Wt 129.4 lb

## 2020-12-26 DIAGNOSIS — R079 Chest pain, unspecified: Secondary | ICD-10-CM

## 2020-12-26 DIAGNOSIS — R011 Cardiac murmur, unspecified: Secondary | ICD-10-CM

## 2020-12-26 DIAGNOSIS — I493 Ventricular premature depolarization: Secondary | ICD-10-CM

## 2020-12-26 DIAGNOSIS — R072 Precordial pain: Secondary | ICD-10-CM

## 2020-12-26 DIAGNOSIS — I10 Essential (primary) hypertension: Secondary | ICD-10-CM | POA: Diagnosis not present

## 2020-12-26 NOTE — Patient Instructions (Signed)
Medication Instructions:  Your physician recommends that you continue on your current medications as directed. Please refer to the Current Medication list given to you today.  *If you need a refill on your cardiac medications before your next appointment, please call your pharmacy*   Testing/Procedures: Your physician has requested that you have an echocardiogram. Echocardiography is a painless test that uses sound waves to create images of your heart. It provides your doctor with information about the size and shape of your heart and how well your heart's chambers and valves are working. This procedure takes approximately one hour. There are no restrictions for this procedure.  Your physician has requested that you have a lexiscan myoview. For further information please visit [email or web address]. Please follow instruction sheet, as given.  Follow-Up: At [facility], you and your health needs are our priority.  As part of our continuing mission to provide you with exceptional heart care, we have created designated Provider Care Teams.  These Care Teams include your primary Cardiologist (physician) and Advanced Practice Providers (APPs -  Physician Assistants and Nurse Practitioners) who all work together to provide you with the care you need, when you need it.  Your next appointment:   1 year(s)  The format for your next appointment:   In Person  Provider:   You may see Fransico Him, MD or one of the following Advanced Practice Providers on your designated Care Team:   Melina Copa, PA-C Ermalinda Barrios, PA-C

## 2020-12-26 NOTE — Addendum Note (Signed)
Addended by: Antonieta Iba on: 12/26/2020 04:19 PM   Modules accepted: Orders

## 2020-12-26 NOTE — Progress Notes (Signed)
Cardiology CONSULT Note    Date:  12/26/2020   ID:  Maria Collier, DOB May 20, 1933, MRN [id number]  PCP:  Kelton Pillar, MD  Cardiologist:  Fransico Him, MD   Chief Complaint  Patient presents with   New Patient (Initial Visit)    Chest pain and PVCs     History of Present Illness:  Maria Collier is a 87 y.o. female who is being seen today for the evaluation of chest pain and PVCs at the request of Kelton Pillar, MD.  This is an 87yo female with a hx of DM, deafness following meningitis, HLD, HTN who is referred for evaluation of chest pain.  She had a nuclear stress test for PVCs and SOB in 2012 that showed no ischemia and echo showed normal LVF with mild MR.   History is provided by an interpreter for death.  The pain is left sided and she cannot sleep on that side.  It started 2 months ago and feels like a sharp pain in the chest.  She has been seen in Urgent Care several times in the past.  The pain lasts for seconds and comes and goes.  It is nonexertional with no radiation.  It hurts if she lays on her left side and she will have to sleep on her right side.  It only occurs when she sleeps at night on her left side.  She had breast surgery on her left side 4 years ago.  She never has pain when sitting up or with activity.  She occasionally has some SOB off and on but nonexertional and will drink some water and resolves.  She denies any nausea or diaphoresis. She has never smoked.  Her Dad and brother had heart disease.  Her father had CABG and her brother had a stent.    Past Medical History:  Diagnosis Date   Deafness 1939   following meningitis   Diabetes mellitus without complication ([place])    Dyspnea    at night if the house is hot   Family history of breast cancer    Family history of colonic polyps    Family history of ovarian cancer    GERD (gastroesophageal reflux disease)    occasionally   Headache    sometimes   Hyperlipidemia    Hypertension     Meningitis    Osteoporosis    Pneumonia    yrs. ago    Past Surgical History:  Procedure Laterality Date   ABDOMINAL HYSTERECTOMY     BREAST LUMPECTOMY WITH RADIOACTIVE SEED LOCALIZATION Left 05/15/2017   Procedure: BREAST LUMPECTOMY WITH RADIOACTIVE SEED LOCALIZATION;  Surgeon: Fanny Skates, MD;  Location: [place];  Service: General;  Laterality: Left;    Current Medications: Current Meds  Medication Sig   acetaminophen (TYLENOL) 500 MG tablet Take 500 mg by mouth daily as needed for mild pain or headache.   aspirin 81 MG tablet Take 81 mg by mouth at bedtime.    cholecalciferol (VITAMIN D) 1000 units tablet Take 1,000 Units by mouth daily.   erythromycin ophthalmic ointment APPLY TO RIGHT LOWER LID TWICE A DAY FOR 2 WEEKS   hyaluronate sodium (RADIAPLEXRX) GEL Apply 1 application topically 2 (two) times daily.   hydrochlorothiazide (HYDRODIURIL) 25 MG tablet Take 25 mg by mouth daily.   meloxicam (MOBIC) 7.5 MG tablet Take 1 tablet (7.5 mg total) by mouth daily.   metFORMIN (GLUCOPHAGE-XR) 500 MG 24 hr tablet Take 500 mg by mouth every  evening.   metoprolol succinate (TOPROL-XL) 100 MG 24 hr tablet Take 100 mg by mouth daily. Take with or immediately following a meal.   non-metallic deodorant (ALRA) MISC Apply 1 application topically daily as needed.   Polyethyl Glycol-Propyl Glycol (SYSTANE OP) Apply 1 drop to eye 4 (four) times daily as needed (dry eyes).   simvastatin (ZOCOR) 40 MG tablet Take 40 mg by mouth every evening.    traMADol (ULTRAM) 50 MG tablet Take 1 tablet (50 mg total) by mouth every 6 (six) hours as needed.   vitamin C (ASCORBIC ACID) 500 MG tablet Take 500 mg by mouth daily.   vitamin E 1000 UNIT capsule Take 1,000 Units by mouth daily.    Allergies:   Altace [ramipril], Hyzaar [losartan potassium-hctz], and Hydrocodone   Social History   Socioeconomic History   Marital status: Married    Spouse name: Not on file   Number of children: Not on file   Years  of education: Not on file   Highest education level: Not on file  Occupational History   Not on file  Tobacco Use   Smoking status: Never   Smokeless tobacco: Never  Vaping Use   Vaping Use: Never used  Substance and Sexual Activity   Alcohol use: No   Drug use: No   Sexual activity: Not on file  Other Topics Concern   Not on file  Social History Narrative   Not on file   Social Determinants of Health   Financial Resource Strain: Not on file  Food Insecurity: Not on file  Transportation Needs: Not on file  Physical Activity: Not on file  Stress: Not on file  Social Connections: Not on file     Family History:  The patient's family history includes Breast cancer (age of onset: 35) in her daughter; Cancer in her paternal grandfather and paternal grandmother; Colon polyps in her daughter; Heart disease in her father; Hypertension in her brother; Other in her sister; Ovarian cancer in her daughter.   ROS:   Please see the history of present illness.    ROS All other systems reviewed and are negative.  No flowsheet data found.     PHYSICAL EXAM:   VS:  BP 128/66   Pulse 70   Ht 5\' 2"  (1.575 m)   Wt 129 lb 6.4 oz (58.7 kg)   SpO2 98%   BMI 23.67 kg/m    GEN: Well nourished, well developed, in no acute distress  HEENT: normal  Neck: no JVD, carotid bruits, or masses Cardiac: RRR; no  rubs, or gallops,no edema. 2/6 SM at LUSB>LLSB. Intact distal pulses bilaterally.  Respiratory:  clear to auscultation bilaterally, normal work of breathing GI: soft, nontender, nondistended, + BS MS: no deformity or atrophy  Skin: warm and dry, no rash Neuro:  Alert and Oriented x 3, Strength and sensation are intact Psych: euthymic mood, full affect  Wt Readings from Last 3 Encounters:  12/26/20 129 lb 6.4 oz (58.7 kg)  02/20/18 122 lb 14.4 oz (55.7 kg)  02/01/18 126 lb 8.7 oz (57.4 kg)      Studies/Labs Reviewed:   EKG:  EKG is ordered today.  The ekg ordered today  demonstrates NSR with iRBBB and PVCs  Recent Labs: No results found for requested labs within last 8760 hours.   Lipid Panel No results found for: CHOL, TRIG, HDL, CHOLHDL, VLDL, LDLCALC, LDLDIRECT   Additional studies/ records that were reviewed today include:  Nuclear stress test 2012  ASSESSMENT:    1. Chest pain of uncertain etiology   2. Primary hypertension   3. PVC (premature ventricular contraction)   4. Heart murmur      PLAN:  In order of problems listed above:  Chest pain -pain is atypical and likely musculoskeletal from her prior lumpectomy and XRT -Given her fm hx of CAD, HLD, HTN, I will get a Lexiscan myoview to rule out ischemia -Shared Decision Making/Informed Consent The risks [chest pain, shortness of breath, cardiac arrhythmias, dizziness, blood pressure fluctuations, myocardial infarction, stroke/transient ischemic attack, nausea, vomiting, allergic reaction, radiation exposure, metallic taste sensation and life-threatening complications (estimated to be 1 in 10,000)], benefits (risk stratification, diagnosing coronary artery disease, treatment guidance) and alternatives of a nuclear stress test were discussed in detail with Ms. Dehoyos and she agrees to proceed.  2.  PVCs -these are not knew -she denies any palpitations  3.  HTN -BP controlled on exam today -Continue prescription drug management with Toprol XL 100mg  daily, HCTZ 25mg  daily   4.  Heart murmur -she has never been told she had a murmur until she was seen at Urgent Care -I will check a 2D echo to assess murmur  Time Spent: 20 minutes total time of encounter, including 15 minutes spent in face-to-face patient care on the date of this encounter. This time includes coordination of care and counseling regarding above mentioned problem list. Remainder of non-face-to-face time involved reviewing chart documents/testing relevant to the patient encounter and documentation in the medical record. I  have independently reviewed documentation from referring provider  Medication Adjustments/Labs and Tests Ordered: Current medicines are reviewed at length with the patient today.  Concerns regarding medicines are outlined above.  Medication changes, Labs and Tests ordered today are listed in the Patient Instructions below.  There are no Patient Instructions on file for this visit.   Signed, Fransico Him, MD  12/26/2020 9:23 AM    [address], [place], [place]  [address] Phone: [phone]; Fax: [phone]

## 2020-12-26 NOTE — Addendum Note (Signed)
Addended by: Antonieta Iba on: 12/26/2020 09:30 AM   Modules accepted: Orders

## 2020-12-27 NOTE — Addendum Note (Signed)
Addended by: Sueanne Margarita on: 12/27/2020 08:44 AM   Modules accepted: Orders

## 2021-01-09 ENCOUNTER — Telehealth (HOSPITAL_COMMUNITY): Payer: Self-pay | Admitting: Cardiology

## 2021-01-09 NOTE — Telephone Encounter (Signed)
Patient called and cancelled echocardiogram and Myoview for reason below:  01/09/2021 9:17 AM YI:AXKPV, LESLIE O  Cancel Rsn: Patient (pt does not want to go through with the appt/please contact sign language interpreter to cancel as well).  Order will be removed from the [place].

## 2021-01-12 ENCOUNTER — Other Ambulatory Visit (HOSPITAL_COMMUNITY): Payer: Medicare HMO

## 2021-01-12 ENCOUNTER — Encounter (HOSPITAL_COMMUNITY): Payer: Medicare HMO

## 2021-02-21 DIAGNOSIS — Z833 Family history of diabetes mellitus: Secondary | ICD-10-CM | POA: Diagnosis not present

## 2021-02-21 DIAGNOSIS — E119 Type 2 diabetes mellitus without complications: Secondary | ICD-10-CM | POA: Diagnosis not present

## 2021-02-21 DIAGNOSIS — Z853 Personal history of malignant neoplasm of breast: Secondary | ICD-10-CM | POA: Diagnosis not present

## 2021-02-21 DIAGNOSIS — Z823 Family history of stroke: Secondary | ICD-10-CM | POA: Diagnosis not present

## 2021-02-21 DIAGNOSIS — I499 Cardiac arrhythmia, unspecified: Secondary | ICD-10-CM | POA: Diagnosis not present

## 2021-02-21 DIAGNOSIS — I10 Essential (primary) hypertension: Secondary | ICD-10-CM | POA: Diagnosis not present

## 2021-02-21 DIAGNOSIS — Z7984 Long term (current) use of oral hypoglycemic drugs: Secondary | ICD-10-CM | POA: Diagnosis not present

## 2021-02-21 DIAGNOSIS — Z8249 Family history of ischemic heart disease and other diseases of the circulatory system: Secondary | ICD-10-CM | POA: Diagnosis not present

## 2021-02-21 DIAGNOSIS — E785 Hyperlipidemia, unspecified: Secondary | ICD-10-CM | POA: Diagnosis not present

## 2021-02-21 DIAGNOSIS — Z7982 Long term (current) use of aspirin: Secondary | ICD-10-CM | POA: Diagnosis not present

## 2021-02-21 DIAGNOSIS — R32 Unspecified urinary incontinence: Secondary | ICD-10-CM | POA: Diagnosis not present

## 2021-02-21 DIAGNOSIS — M1990 Unspecified osteoarthritis, unspecified site: Secondary | ICD-10-CM | POA: Diagnosis not present

## 2021-03-01 DIAGNOSIS — I493 Ventricular premature depolarization: Secondary | ICD-10-CM | POA: Diagnosis not present

## 2021-03-01 DIAGNOSIS — E1121 Type 2 diabetes mellitus with diabetic nephropathy: Secondary | ICD-10-CM | POA: Diagnosis not present

## 2021-03-01 DIAGNOSIS — Z1331 Encounter for screening for depression: Secondary | ICD-10-CM | POA: Diagnosis not present

## 2021-03-01 DIAGNOSIS — Z0000 Encounter for general adult medical examination without abnormal findings: Secondary | ICD-10-CM | POA: Diagnosis not present

## 2021-03-01 DIAGNOSIS — E559 Vitamin D deficiency, unspecified: Secondary | ICD-10-CM | POA: Diagnosis not present

## 2021-03-01 DIAGNOSIS — E7800 Pure hypercholesterolemia, unspecified: Secondary | ICD-10-CM | POA: Diagnosis not present

## 2021-03-01 DIAGNOSIS — D0510 Intraductal carcinoma in situ of unspecified breast: Secondary | ICD-10-CM | POA: Diagnosis not present

## 2021-03-01 DIAGNOSIS — I129 Hypertensive chronic kidney disease with stage 1 through stage 4 chronic kidney disease, or unspecified chronic kidney disease: Secondary | ICD-10-CM | POA: Diagnosis not present

## 2021-03-01 DIAGNOSIS — I499 Cardiac arrhythmia, unspecified: Secondary | ICD-10-CM | POA: Diagnosis not present

## 2021-03-01 DIAGNOSIS — N183 Chronic kidney disease, stage 3 unspecified: Secondary | ICD-10-CM | POA: Diagnosis not present

## 2021-03-01 DIAGNOSIS — Z1389 Encounter for screening for other disorder: Secondary | ICD-10-CM | POA: Diagnosis not present

## 2021-03-01 DIAGNOSIS — Z7984 Long term (current) use of oral hypoglycemic drugs: Secondary | ICD-10-CM | POA: Diagnosis not present

## 2021-03-24 DIAGNOSIS — L210 Seborrhea capitis: Secondary | ICD-10-CM | POA: Diagnosis not present

## 2021-04-24 ENCOUNTER — Other Ambulatory Visit: Payer: Self-pay

## 2021-04-24 ENCOUNTER — Encounter: Payer: Self-pay | Admitting: Cardiology

## 2021-04-24 ENCOUNTER — Ambulatory Visit (HOSPITAL_COMMUNITY): Payer: Medicare HMO | Attending: Cardiology

## 2021-04-24 DIAGNOSIS — I10 Essential (primary) hypertension: Secondary | ICD-10-CM | POA: Insufficient documentation

## 2021-04-24 DIAGNOSIS — I272 Pulmonary hypertension, unspecified: Secondary | ICD-10-CM | POA: Insufficient documentation

## 2021-04-24 DIAGNOSIS — R072 Precordial pain: Secondary | ICD-10-CM | POA: Diagnosis not present

## 2021-04-24 DIAGNOSIS — R011 Cardiac murmur, unspecified: Secondary | ICD-10-CM | POA: Diagnosis not present

## 2021-04-24 DIAGNOSIS — I493 Ventricular premature depolarization: Secondary | ICD-10-CM

## 2021-04-24 DIAGNOSIS — R079 Chest pain, unspecified: Secondary | ICD-10-CM | POA: Insufficient documentation

## 2021-04-24 DIAGNOSIS — I071 Rheumatic tricuspid insufficiency: Secondary | ICD-10-CM | POA: Insufficient documentation

## 2021-04-24 DIAGNOSIS — I340 Nonrheumatic mitral (valve) insufficiency: Secondary | ICD-10-CM | POA: Insufficient documentation

## 2021-04-24 DIAGNOSIS — Z1231 Encounter for screening mammogram for malignant neoplasm of breast: Secondary | ICD-10-CM | POA: Diagnosis not present

## 2021-04-24 DIAGNOSIS — I371 Nonrheumatic pulmonary valve insufficiency: Secondary | ICD-10-CM | POA: Insufficient documentation

## 2021-04-24 LAB — ECHOCARDIOGRAM COMPLETE
Area-P 1/2: 5.32 cm2
MV M vel: 5.65 m/s
MV Peak grad: 127.8 mmHg
MV VTI: 1.57 cm2
P 1/2 time: 319 msec
S' Lateral: 3.1 cm

## 2021-04-25 ENCOUNTER — Telehealth: Payer: Self-pay

## 2021-04-25 DIAGNOSIS — I071 Rheumatic tricuspid insufficiency: Secondary | ICD-10-CM

## 2021-04-25 DIAGNOSIS — I340 Nonrheumatic mitral (valve) insufficiency: Secondary | ICD-10-CM

## 2021-04-25 NOTE — Telephone Encounter (Signed)
-----   Message from Sueanne Margarita, MD sent at 04/24/2021  5:45 PM EST ----- Echo showed normal LVF with mildly thickened heart muscle and increased stiffness of heart muscle, moderately elevated BP in the lung vessels, severely leaky MV, severely enlarged LA, moderately leaky PV and TV.  Compared to echo 2016,  MR and TR have significantly worsened and now has moderate PHTN.   Please refer to Structural heart clinic as she will need right and left heart cath as well as TEE to assess MV further

## 2021-04-25 NOTE — Telephone Encounter (Signed)
The patient's daughter has been notified of the result and verbalized understanding.  All questions (if any) were answered. Antonieta Iba, RN 04/25/2021 3:30 PM  Referral has been placed.

## 2021-04-28 ENCOUNTER — Telehealth: Payer: Self-pay

## 2021-04-28 NOTE — Telephone Encounter (Signed)
Spoke with the patient's daughter, Tattiana, [facility]) as the patient communicates with ASL.   Scheduled the patient for Structural Heart consult with Dr. Ali Lowe on 05/10/2021.  Anjanae lives in [place] and will not be able to physically attend the appointment.  I will try to arrange transportation for the patient. An ASL interpretor will attend consult. Since Ailee will not be in [place], informed her she will listen in on speakerphone (phone # [phone]) during the visit.  She was grateful for call and agrees with plan.

## 2021-05-01 ENCOUNTER — Telehealth (HOSPITAL_COMMUNITY): Payer: Self-pay | Admitting: Licensed Clinical Social Worker

## 2021-05-01 NOTE — Telephone Encounter (Signed)
CSW referred to assist patient with transportation needs to appointment on 05-10-21 at 9 am at the [facility]. Transportation confirmed door to door arrangements and will notify daughter. CSW available as needed. Raquel Sarna, [place], [place]

## 2021-05-05 ENCOUNTER — Telehealth: Payer: Self-pay | Admitting: Licensed Clinical Social Worker

## 2021-05-05 NOTE — Telephone Encounter (Signed)
The patient's daughter called because she said her mother was getting anxious she had not heard about transportation. Reiterated to her that Kennyth Lose has confirmed door to door transportation.  The daughter [name]) requests [facility] Lose call to confirm herself. Phone: [phone] She was grateful for assistance.

## 2021-05-05 NOTE — Telephone Encounter (Signed)
CSW contacted patient's daughter to confirm that [facility] [employer] has been arranged for door to door service for her [facility] appointment on 05-10-21. Transport will call daughter to confirm pick up time. Raquel Sarna, [place], [place]

## 2021-05-09 NOTE — Progress Notes (Addendum)
Patient ID: Maria Collier MRN: [id number] DOB/AGE: 08-Sep-1933 88 y.o.  Primary Care Physician:Griffin, Margaretha Sheffield, MD Primary Cardiologist: Golden Hurter, MD   FOCUSED CARDIOVASCULAR PROBLEM LIST:   1.  Severe mitral and tricuspid regurgitation 2.  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  Type 2 diabetes 4.  Hyperlipidemia 5.  Hypertension  HISTORY OF PRESENT ILLNESS: The patient is a 88 y.o. female with the indicated medical history here for conditions regarding the patient's severe mitral and tricuspid regurgitation.  The patient had been seen by Dr. Radford Pax recently due to chest pain and PVCs.  The chest pain was thought to be atypical and likely musculoskeletal in nature.  An echocardiogram was performed due to the detection of a murmur.  The patient tells me that she has had increasing shortness of breath over the last month or so.  This did start with low-level symptoms maybe a year ago.  She did not notice an abrupt increase in her symptoms as they are more gradual.  She used to walk for about an hour a day.  Now she has trouble going to her mailbox.  She lives by herself and does most of her activities of daily living but has had a great difficulty with these over the last several weeks.  She also reports orthopnea when she lays on her left side but not on her right.  She is noticed a little bit of peripheral edema.  She is also noted palpitations but has had no presyncope or syncope.  She describes a chest fullness at rest and with exertion.  She has definitely experienced a decline in her functional capacity and is very concerned about this.  She would like to get back to her baseline functional capacity.  Fortunately she has not required any emergency room visits or hospitalizations recently for this issue.  She sees a Pharmacist, community every 6 months and has had no issues with her dental health.  Past Medical History:  Diagnosis Date   Deafness 1939   following meningitis   Diabetes  mellitus without complication ([facility])    Dyspnea    at night if the house is hot   Family history of breast cancer    Family history of colonic polyps    Family history of ovarian cancer    GERD (gastroesophageal reflux disease)    occasionally   Headache    sometimes   Hyperlipidemia    Hypertension    Meningitis    Mitral regurgitation    severe by echo 04/2021   Osteoporosis    Pneumonia    yrs. ago   Pulmonary HTN ([place])    PASP in the 50's by echo 04/2021   Pulmonic regurgitation    moderate by echo 04/2021   Tricuspid regurgitation    moderate to severe by echo 04/2021    Past Surgical History:  Procedure Laterality Date   ABDOMINAL HYSTERECTOMY     BREAST LUMPECTOMY WITH RADIOACTIVE SEED LOCALIZATION Left 05/15/2017   Procedure: BREAST LUMPECTOMY WITH RADIOACTIVE SEED LOCALIZATION;  Surgeon: Fanny Skates, MD;  Location: [facility] OR;  Service: General;  Laterality: Left;    Family History  Problem Relation Age of Onset   Ovarian cancer Daughter        64s   Breast cancer Daughter 67   Heart disease Father    Cancer Paternal Grandmother        ? stomach cancer   Cancer Paternal Grandfather    Other Sister  d. 66s, had stomach issues, e.g. ulcers   Hypertension Brother    Colon polyps Daughter        6 polyps on first colonoscopy    Social History   Socioeconomic History   Marital status: Married    Spouse name: Not on file   Number of children: Not on file   Years of education: Not on file   Highest education level: Not on file  Occupational History   Not on file  Tobacco Use   Smoking status: Never   Smokeless tobacco: Never  Vaping Use   Vaping Use: Never used  Substance and Sexual Activity   Alcohol use: No   Drug use: No   Sexual activity: Not on file  Other Topics Concern   Not on file  Social History Narrative   Not on file   Social Determinants of Health   Financial Resource Strain: Not on file  Food Insecurity: Not on file   Transportation Needs: No Transportation Needs   Lack of Transportation (Medical): No   Lack of Transportation (Non-Medical): No  Physical Activity: Not on file  Stress: Not on file  Social Connections: Not on file  Intimate Partner Violence: Not on file     Prior to Admission medications   Medication Sig Start Date End Date Taking? Authorizing Provider  acetaminophen (TYLENOL) 500 MG tablet Take 500 mg by mouth daily as needed for mild pain or headache.    [provider]  aspirin 81 MG tablet Take 81 mg by mouth at bedtime.     [provider]  cholecalciferol (VITAMIN D) 1000 units tablet Take 1,000 Units by mouth daily.    [provider]  erythromycin ophthalmic ointment APPLY TO RIGHT LOWER LID TWICE A DAY FOR 2 WEEKS 06/10/17   [provider]  hyaluronate sodium (RADIAPLEXRX) GEL Apply 1 application topically 2 (two) times daily.    [provider]  hydrochlorothiazide (HYDRODIURIL) 25 MG tablet Take 25 mg by mouth daily.    [provider]  LORazepam (ATIVAN) 0.5 MG tablet Take 1 tablet (0.5 mg total) by mouth every 8 (eight) hours as needed for anxiety or sleep. Patient not taking: Reported on 12/26/2020 08/29/17   Gery Pray, MD  meloxicam (MOBIC) 7.5 MG tablet Take 1 tablet (7.5 mg total) by mouth daily. 02/01/18   Virgel Manifold, MD  metFORMIN (GLUCOPHAGE-XR) 500 MG 24 hr tablet Take 500 mg by mouth every evening.    [provider]  metoprolol succinate (TOPROL-XL) 100 MG 24 hr tablet Take 100 mg by mouth daily. Take with or immediately following a meal.    [provider]  non-metallic deodorant Jethro Poling) MISC Apply 1 application topically daily as needed.    [provider]  Polyethyl Glycol-Propyl Glycol (SYSTANE OP) Apply 1 drop to eye 4 (four) times daily as needed (dry eyes).    [provider]  simvastatin (ZOCOR) 40 MG tablet Take 40 mg by mouth every evening.     [provider]  traMADol (ULTRAM) 50 MG tablet Take 1 tablet (50 mg total) by mouth every 6 (six) hours as needed. 01/31/18   Nat Christen, MD  vitamin C (ASCORBIC ACID) 500 MG tablet Take 500 mg by mouth daily.    [provider]  vitamin E 1000 UNIT capsule Take 1,000 Units by mouth daily.    [provider]    Allergies  Allergen Reactions   Altace [Ramipril] Cough    COUGH  Hyzaar [Losartan Potassium-Hctz] Cough    COUGH   Hydrocodone Cough    COUGH     REVIEW OF SYSTEMS:  General: no fevers/chills/night sweats Eyes: no blurry vision, diplopia, or amaurosis ENT: no sore throat or hearing loss Resp: no cough, wheezing, or hemoptysis CV: no edema or palpitations GI: no abdominal pain, nausea, vomiting, diarrhea, or constipation GU: no dysuria, frequency, or hematuria Skin: no rash Neuro: no headache, numbness, tingling, or weakness of extremities Musculoskeletal: no joint pain or swelling Heme: no bleeding, DVT, or easy bruising Endo: no polydipsia or polyuria  BP 126/70    Pulse 73    Ht 5\' 2"  (1.575 m)    Wt 126 lb 9.6 oz (57.4 kg)    SpO2 96%    BMI 23.16 kg/m   PHYSICAL EXAM: GEN:  AO x 3 in no acute distress HEENT: normal Dentition: Normal Neck: JVP normal. +2carotid upstrokes without bruits. No thyromegaly. Lungs: equal expansion, clear bilaterally CV: Apex is discrete and nondisplaced, RRR with 4/6 holosystolic murmur Abd: soft, non-tender, non-distended; no bruit; positive bowel sounds Ext: no edema, ecchymoses, or cyanosis Vascular: 2+ femoral pulses, 2+ radial pulses       Skin: warm and dry without rash Neuro: CN II-XII grossly intact; motor and sensory grossly intact    DATA AND STUDIES:  EKG: Sinus rhythm with PVCs; sinus rhythm with leftward axis and PAC.  2D ECHO:   12/23  1. Left ventricular ejection fraction, by estimation, is 60 to 65%. The  left ventricle has normal function. The left ventricle has no regional  wall motion  abnormalities. There is mild left ventricular hypertrophy.  Left ventricular diastolic parameters  are consistent with Grade III diastolic dysfunction (restrictive).  Elevated left atrial pressure.   2. Right ventricular systolic function is normal. The right ventricular  size is mildly enlarged. There is moderately elevated pulmonary artery  systolic pressure. The estimated right ventricular systolic pressure is  36.6 mmHg.   3. Left atrial size was severely dilated.   4. Right atrial size was mildly dilated.   5. The mitral valve is degenerative. Severe mitral valve regurgitation.  Severe mitral annular calcification.   6. Pulmonic valve regurgitation is moderate.   7. The tricuspid valve is abnormal. Tricuspid valve regurgitation is  moderate to severe.   8. The aortic valve is tricuspid. Aortic valve regurgitation is trivial.  Aortic valve sclerosis is present, with no evidence of aortic valve  stenosis.   9. The inferior vena cava is normal in size with greater than 50%  respiratory variability, suggesting right atrial pressure of 3 mmHg.   CARDIAC CATH: N/A   STS RISK CALCULATOR:  Isolated MVR Risk of Mortality: 7.815% Renal Failure: 6.096% Permanent Stroke: 2.945% Prolonged Ventilation: 17.837% DSW Infection: 0.055% Reoperation: 7.782% Morbidity or Mortality: 27.047% Short Length of Stay: 12.128% Long Length of Stay: 17.212%  MV Repair  Risk of Mortality: 3.883% Renal Failure: 3.890% Permanent Stroke: 4.791% Prolonged Ventilation: 8.867% DSW Infection: 0.029% Reoperation: 4.168% Morbidity or Mortality: 15.626% Short Length of Stay: 21.851% Long Length of Stay: 9.489%  NHYA CLASS: 2-3  ASSESSMENT AND PLAN:   Mitral valve insufficiency, unspecified etiology - Plan: EKG 12-Lead  Tricuspid valve insufficiency, unspecified etiology  The patient has developed lifestyle limiting dyspnea.  I get the impression the patient was very functional for  her age.  She has not developed right-sided symptoms but she does have moderate to severe tricuspid regurgitation.  This is likely due to her severe  mitral regurgitation.  I did review the patient's echocardiogram.  It looks like she has likely degenerative mitral regurgitation from posterior prolapse.  She does have mitral annular calcification as well.  Seems to be mostly on the posterior aspect of the annulus.  I had a long conversation with the patient and her daughter who was on the phone with the assistance of an ASL interpreter.  We will proceed with an evaluation including a TEE and right and left heart catheterization with coronary angiography study.  If this confirms what I suspect to be degenerative mitral regurgitation and it is amenable to transcatheter mitral edge-to-edge repair we will refer the patient for cardiothoracic surgical opinion as well.  To temporize her symptoms I will start Lasix 20 mg daily BID.  We will check lab work in 1 week.  Further recommendations will follow review of this testing.  I have personally reviewed the patients imaging data as summarized above.  I have reviewed the natural history of mitral regurgitation with the patient and family members who are present today. We have discussed the limitations of medical therapy and the poor prognosis associated with symptomatic mitral regurgitation. We have also reviewed potential treatment options, including palliative medical therapy, conventional mitral valve surgery, and transcatheter mitral edge-to-edge repair. We discussed treatment options in the context of this patient's specific comorbid medical conditions.   All of the patient's questions were answered today. Will make further recommendations based on the results of studies outlined above.   Total time spent with patient today 60 minutes. This includes reviewing records, evaluating the patient and coordinating care.   Early Osmond, MD  05/10/2021 9:59 AM     [facility] [place], [place], [place]  [address] Phone: [phone]; Fax: [phone]

## 2021-05-10 ENCOUNTER — Other Ambulatory Visit: Payer: Self-pay

## 2021-05-10 ENCOUNTER — Encounter: Payer: Self-pay | Admitting: Internal Medicine

## 2021-05-10 ENCOUNTER — Ambulatory Visit: Payer: Medicare HMO | Admitting: Internal Medicine

## 2021-05-10 VITALS — BP 126/70 | HR 73 | Ht 62.0 in | Wt 126.6 lb

## 2021-05-10 DIAGNOSIS — I071 Rheumatic tricuspid insufficiency: Secondary | ICD-10-CM

## 2021-05-10 DIAGNOSIS — I340 Nonrheumatic mitral (valve) insufficiency: Secondary | ICD-10-CM | POA: Diagnosis not present

## 2021-05-10 MED ORDER — FUROSEMIDE 20 MG PO TABS: 20.0000 mg | ORAL_TABLET | Freq: Two times a day (BID) | ORAL | 3 refills | Status: DC

## 2021-05-10 NOTE — Patient Instructions (Addendum)
Medication Instructions:  1) START Furosemide 20mg  twice daily.  Take one tablet early morning and the second tablet early evening.  *If you need a refill on your cardiac medications before your next appointment, please call your pharmacy*   Lab Work: BMET and CBC in 1 week  If you have labs (blood work) drawn today and your tests are completely normal, you will receive your results only by: [employer] (if you have MyChart) OR A paper copy in the mail If you have any lab test that is abnormal or we need to change your treatment, we will call you to review the results.   Testing/Procedures: Your physician has requested that you have a TEE. During a TEE, sound waves are used to create images of your heart. It provides your doctor with information about the size and shape of your heart and how well your heart's chambers and valves are working. In this test, a transducer is attached to the end of a flexible tube that's guided down your throat and into your esophagus (the tube leading from you mouth to your stomach) to get a more detailed image of your heart. You are not awake for the procedure. Please see the instruction sheet given to you today. For further information please visit [facility]ugeFiesta.tn.  Your physician has requested that you have a cardiac catheterization. Cardiac catheterization is used to diagnose and/or treat various heart conditions. Doctors may recommend this procedure for a number of different reasons. The most common reason is to evaluate chest pain. Chest pain can be a symptom of coronary artery disease (CAD), and cardiac catheterization can show whether plaque is narrowing or blocking your heart's arteries. This procedure is also used to evaluate the valves, as well as measure the blood flow and oxygen levels in different parts of your heart. For further information please visit [email or web address][employer][email or web address]. Please follow instruction sheet, as given.   Follow-Up: The  structural team will be in contact with you for next steps.    Other Instructions  TEE INSTRUCTIONS: You are scheduled for a TEE on Tuesday, February 28th with Dr. Sallyanne Kuster.  Please arrive at the [facility] (Main Entrance A) at [facility]: [address] [place], [place] [address] at 7:30 am. Dennis Bast are allowed ONE visitor in the waiting room during your procedure. Both you and your guest must wear masks.  DIET: Nothing to eat or drink after midnight except a sip of water with medications.  MEDICATION INSTRUCTIONS: 1) Hold: Metformin, Furosemide, and HCTZ the morning of the procedure.   2) You may take your other medications as directed with sips of water.  LABS:  You will have labs drawn the morning of your procedure.  FYI: For your safety and to allow [place] to monitor your vital signs accurately, artificial nails, gel coating, SNS etc. need to be removed prior to your procedure. Your procedure may be delayed or cancelled if not removed.  You must have a responsible person to drive you home and stay in the waiting area during your procedure. Failure to do so could result in cancellation.  Bring your insurance cards.  *Special Note: Every effort is made to have your procedure done on time. Occasionally there are emergencies that occur at the hospital that may cause delays. Please be patient if a delay does occur.     [place] [place] OFFICE [place], [place] [address] Dept: [place]: [phone]  RHIANON ZABAWA  05/10/2021  You are scheduled for a Cardiac Catheterization on Tuesday, February 28 with Dr. Daneen Schick.  1. Please arrive at the [facility] (Main Entrance A) at [facility]: [address] [place], [place] [address] at 7:30 AM (This time is two hours before your procedure to ensure your preparation). Free valet parking service is available.   Special note:  Every effort is made to have your procedure done on time. Please understand that emergencies sometimes delay scheduled procedures.  2. Diet: Do not eat solid foods after midnight.  The patient may have clear liquids until 5am upon the day of the procedure.  3. Labs: You will have labs drawn the morning of your procedure  4. Medication instructions in preparation for your procedure:   Contrast Allergy: No   You will need to hold your Metformin, Furosemide and HCTZ the morning of your procedure.    On the morning of your procedure, take your Aspirin and any morning medicines NOT listed above.  You may use sips of water.  5. Plan for one night stay--bring personal belongings. 6. Bring a current list of your medications and current insurance cards. 7. You MUST have a responsible person to drive you home. 8. Someone MUST be with you the first 24 hours after you arrive home or your discharge will be delayed. 9. Please wear clothes that are easy to get on and off and wear slip-on shoes.  Thank you for allowing [place] to care for you!   -- [place] [facility]

## 2021-05-29 ENCOUNTER — Other Ambulatory Visit: Payer: Medicare HMO | Admitting: *Deleted

## 2021-05-29 ENCOUNTER — Other Ambulatory Visit: Payer: Self-pay

## 2021-05-29 ENCOUNTER — Encounter (HOSPITAL_COMMUNITY): Payer: Self-pay | Admitting: Cardiovascular Disease

## 2021-05-29 DIAGNOSIS — E119 Type 2 diabetes mellitus without complications: Secondary | ICD-10-CM | POA: Diagnosis not present

## 2021-05-29 DIAGNOSIS — I340 Nonrheumatic mitral (valve) insufficiency: Secondary | ICD-10-CM | POA: Diagnosis not present

## 2021-05-29 DIAGNOSIS — H3122 Choroidal dystrophy (central areolar) (generalized) (peripapillary): Secondary | ICD-10-CM | POA: Diagnosis not present

## 2021-05-29 DIAGNOSIS — H35373 Puckering of macula, bilateral: Secondary | ICD-10-CM | POA: Diagnosis not present

## 2021-05-29 DIAGNOSIS — Z961 Presence of intraocular lens: Secondary | ICD-10-CM | POA: Diagnosis not present

## 2021-05-29 DIAGNOSIS — H35341 Macular cyst, hole, or pseudohole, right eye: Secondary | ICD-10-CM | POA: Diagnosis not present

## 2021-05-29 LAB — BASIC METABOLIC PANEL
BUN/Creatinine Ratio: 28 (ref 12–28)
BUN: 31 mg/dL — ABNORMAL HIGH (ref 8–27)
CO2: 32 mmol/L — ABNORMAL HIGH (ref 20–29)
Calcium: 9.9 mg/dL (ref 8.7–10.3)
Chloride: 94 mmol/L — ABNORMAL LOW (ref 96–106)
Creatinine, Ser: 1.09 mg/dL — ABNORMAL HIGH (ref 0.57–1.00)
Glucose: 154 mg/dL — ABNORMAL HIGH (ref 70–99)
Potassium: 3.5 mmol/L (ref 3.5–5.2)
Sodium: 143 mmol/L (ref 134–144)
eGFR: 50 mL/min/{1.73_m2} — ABNORMAL LOW (ref >59)

## 2021-05-29 LAB — CBC
Hematocrit: 38.3 % (ref 34.0–46.6)
Hemoglobin: 12.6 g/dL (ref 11.1–15.9)
MCH: 31.3 pg (ref 26.6–33.0)
MCHC: 32.9 g/dL (ref 31.5–35.7)
MCV: 95 fL (ref 79–97)
Platelets: 189 10*3/uL (ref 150–450)
RBC: 4.02 x10E6/uL (ref 3.77–5.28)
RDW: 11.9 % (ref 11.7–15.4)
WBC: 7.3 10*3/uL (ref 3.4–10.8)

## 2021-05-29 NOTE — Progress Notes (Signed)
Attempted to obtain medical history via telephone, unable to reach at this time. I left a voicemail on daughters phone to return pre surgical testing department's phone call.

## 2021-05-30 ENCOUNTER — Other Ambulatory Visit: Payer: Self-pay

## 2021-05-30 MED ORDER — FUROSEMIDE 20 MG PO TABS: 20.0000 mg | ORAL_TABLET | Freq: Every day | ORAL | 3 refills | Status: DC

## 2021-05-31 ENCOUNTER — Other Ambulatory Visit: Payer: Self-pay

## 2021-05-31 ENCOUNTER — Encounter (HOSPITAL_COMMUNITY): Payer: Self-pay | Admitting: Internal Medicine

## 2021-05-31 ENCOUNTER — Other Ambulatory Visit (HOSPITAL_COMMUNITY): Payer: Self-pay

## 2021-05-31 ENCOUNTER — Emergency Department (HOSPITAL_COMMUNITY): Payer: Medicare HMO

## 2021-05-31 ENCOUNTER — Inpatient Hospital Stay (HOSPITAL_COMMUNITY)
Admission: EM | Admit: 2021-05-31 | Discharge: 2021-06-02 | DRG: 287 | Disposition: A | Discharge status: Home / Self Care | Payer: Medicare HMO | Attending: Internal Medicine | Admitting: Internal Medicine

## 2021-05-31 DIAGNOSIS — K219 Gastro-esophageal reflux disease without esophagitis: Secondary | ICD-10-CM | POA: Diagnosis present

## 2021-05-31 DIAGNOSIS — D649 Anemia, unspecified: Secondary | ICD-10-CM | POA: Diagnosis present

## 2021-05-31 DIAGNOSIS — I340 Nonrheumatic mitral (valve) insufficiency: Secondary | ICD-10-CM | POA: Diagnosis not present

## 2021-05-31 DIAGNOSIS — E1122 Type 2 diabetes mellitus with diabetic chronic kidney disease: Secondary | ICD-10-CM | POA: Diagnosis present

## 2021-05-31 DIAGNOSIS — N179 Acute kidney failure, unspecified: Secondary | ICD-10-CM | POA: Diagnosis not present

## 2021-05-31 DIAGNOSIS — N1831 Chronic kidney disease, stage 3a: Secondary | ICD-10-CM | POA: Diagnosis present

## 2021-05-31 DIAGNOSIS — I361 Nonrheumatic tricuspid (valve) insufficiency: Secondary | ICD-10-CM | POA: Diagnosis not present

## 2021-05-31 DIAGNOSIS — Z7984 Long term (current) use of oral hypoglycemic drugs: Secondary | ICD-10-CM

## 2021-05-31 DIAGNOSIS — Z90710 Acquired absence of both cervix and uterus: Secondary | ICD-10-CM

## 2021-05-31 DIAGNOSIS — Z20822 Contact with and (suspected) exposure to covid-19: Secondary | ICD-10-CM | POA: Diagnosis not present

## 2021-05-31 DIAGNOSIS — H9193 Unspecified hearing loss, bilateral: Secondary | ICD-10-CM | POA: Diagnosis present

## 2021-05-31 DIAGNOSIS — R079 Chest pain, unspecified: Secondary | ICD-10-CM | POA: Diagnosis not present

## 2021-05-31 DIAGNOSIS — D631 Anemia in chronic kidney disease: Secondary | ICD-10-CM | POA: Diagnosis not present

## 2021-05-31 DIAGNOSIS — E119 Type 2 diabetes mellitus without complications: Secondary | ICD-10-CM | POA: Diagnosis not present

## 2021-05-31 DIAGNOSIS — Z885 Allergy status to narcotic agent status: Secondary | ICD-10-CM

## 2021-05-31 DIAGNOSIS — I10 Essential (primary) hypertension: Secondary | ICD-10-CM

## 2021-05-31 DIAGNOSIS — I248 Other forms of acute ischemic heart disease: Secondary | ICD-10-CM | POA: Diagnosis not present

## 2021-05-31 DIAGNOSIS — I088 Other rheumatic multiple valve diseases: Secondary | ICD-10-CM | POA: Diagnosis present

## 2021-05-31 DIAGNOSIS — I2721 Secondary pulmonary arterial hypertension: Secondary | ICD-10-CM | POA: Diagnosis not present

## 2021-05-31 DIAGNOSIS — Z8249 Family history of ischemic heart disease and other diseases of the circulatory system: Secondary | ICD-10-CM | POA: Diagnosis not present

## 2021-05-31 DIAGNOSIS — Z7982 Long term (current) use of aspirin: Secondary | ICD-10-CM | POA: Diagnosis not present

## 2021-05-31 DIAGNOSIS — R42 Dizziness and giddiness: Secondary | ICD-10-CM | POA: Diagnosis not present

## 2021-05-31 DIAGNOSIS — E785 Hyperlipidemia, unspecified: Secondary | ICD-10-CM | POA: Diagnosis not present

## 2021-05-31 DIAGNOSIS — I5032 Chronic diastolic (congestive) heart failure: Secondary | ICD-10-CM | POA: Diagnosis not present

## 2021-05-31 DIAGNOSIS — Z8661 Personal history of infections of the central nervous system: Secondary | ICD-10-CM | POA: Diagnosis not present

## 2021-05-31 DIAGNOSIS — Z743 Need for continuous supervision: Secondary | ICD-10-CM | POA: Diagnosis not present

## 2021-05-31 DIAGNOSIS — Z8701 Personal history of pneumonia (recurrent): Secondary | ICD-10-CM | POA: Diagnosis not present

## 2021-05-31 DIAGNOSIS — I130 Hypertensive heart and chronic kidney disease with heart failure and stage 1 through stage 4 chronic kidney disease, or unspecified chronic kidney disease: Secondary | ICD-10-CM | POA: Diagnosis present

## 2021-05-31 DIAGNOSIS — I517 Cardiomegaly: Secondary | ICD-10-CM | POA: Diagnosis not present

## 2021-05-31 DIAGNOSIS — Z888 Allergy status to other drugs, medicaments and biological substances status: Secondary | ICD-10-CM | POA: Diagnosis not present

## 2021-05-31 DIAGNOSIS — I071 Rheumatic tricuspid insufficiency: Secondary | ICD-10-CM | POA: Diagnosis present

## 2021-05-31 DIAGNOSIS — E876 Hypokalemia: Secondary | ICD-10-CM | POA: Diagnosis not present

## 2021-05-31 DIAGNOSIS — I4891 Unspecified atrial fibrillation: Principal | ICD-10-CM | POA: Diagnosis present

## 2021-05-31 DIAGNOSIS — R778 Other specified abnormalities of plasma proteins: Secondary | ICD-10-CM

## 2021-05-31 DIAGNOSIS — M810 Age-related osteoporosis without current pathological fracture: Secondary | ICD-10-CM | POA: Diagnosis present

## 2021-05-31 DIAGNOSIS — I452 Bifascicular block: Secondary | ICD-10-CM | POA: Diagnosis present

## 2021-05-31 DIAGNOSIS — R000 Tachycardia, unspecified: Secondary | ICD-10-CM | POA: Diagnosis not present

## 2021-05-31 DIAGNOSIS — R002 Palpitations: Secondary | ICD-10-CM | POA: Diagnosis not present

## 2021-05-31 DIAGNOSIS — R7989 Other specified abnormal findings of blood chemistry: Secondary | ICD-10-CM | POA: Diagnosis present

## 2021-05-31 DIAGNOSIS — N183 Chronic kidney disease, stage 3 unspecified: Secondary | ICD-10-CM | POA: Diagnosis present

## 2021-05-31 DIAGNOSIS — Z79899 Other long term (current) drug therapy: Secondary | ICD-10-CM

## 2021-05-31 LAB — TSH: TSH: 1.54 u[IU]/mL (ref 0.350–4.500)

## 2021-05-31 LAB — BASIC METABOLIC PANEL
Anion gap: 12 (ref 5–15)
BUN: 32 mg/dL — ABNORMAL HIGH (ref 8–23)
CO2: 32 mmol/L (ref 22–32)
Calcium: 10.2 mg/dL (ref 8.9–10.3)
Chloride: 95 mmol/L — ABNORMAL LOW (ref 98–111)
Creatinine, Ser: 1.18 mg/dL — ABNORMAL HIGH (ref 0.44–1.00)
GFR, Estimated: 45 mL/min — ABNORMAL LOW (ref >60)
Glucose, Bld: 177 mg/dL — ABNORMAL HIGH (ref 70–99)
Potassium: 3.1 mmol/L — ABNORMAL LOW (ref 3.5–5.1)
Sodium: 139 mmol/L (ref 135–145)

## 2021-05-31 LAB — CBC WITH DIFFERENTIAL/PLATELET
Abs Immature Granulocytes: 0.03 10*3/uL (ref 0.00–0.07)
Basophils Absolute: 0 10*3/uL (ref 0.0–0.1)
Basophils Relative: 1 %
Eosinophils Absolute: 0 10*3/uL (ref 0.0–0.5)
Eosinophils Relative: 0 %
HCT: 34.6 % — ABNORMAL LOW (ref 36.0–46.0)
Hemoglobin: 10.9 g/dL — ABNORMAL LOW (ref 12.0–15.0)
Immature Granulocytes: 0 %
Lymphocytes Relative: 19 %
Lymphs Abs: 1.5 10*3/uL (ref 0.7–4.0)
MCH: 31.1 pg (ref 26.0–34.0)
MCHC: 31.5 g/dL (ref 30.0–36.0)
MCV: 98.6 fL (ref 80.0–100.0)
Monocytes Absolute: 0.6 10*3/uL (ref 0.1–1.0)
Monocytes Relative: 8 %
Neutro Abs: 5.8 10*3/uL (ref 1.7–7.7)
Neutrophils Relative %: 72 %
Platelets: 161 10*3/uL (ref 150–400)
RBC: 3.51 MIL/uL — ABNORMAL LOW (ref 3.87–5.11)
RDW: 12.8 % (ref 11.5–15.5)
WBC: 8 10*3/uL (ref 4.0–10.5)
nRBC: 0 % (ref 0.0–0.2)

## 2021-05-31 LAB — HEMOGLOBIN A1C
Hgb A1c MFr Bld: 6.8 % — ABNORMAL HIGH (ref 4.8–5.6)
Mean Plasma Glucose: 148 mg/dL

## 2021-05-31 LAB — PROTIME-INR
INR: 1 (ref 0.8–1.2)
Prothrombin Time: 13.3 seconds (ref 11.4–15.2)

## 2021-05-31 LAB — TROPONIN I (HIGH SENSITIVITY)
Troponin I (High Sensitivity): 29 ng/L — ABNORMAL HIGH (ref <18)
Troponin I (High Sensitivity): 39 ng/L — ABNORMAL HIGH (ref <18)

## 2021-05-31 LAB — BRAIN NATRIURETIC PEPTIDE: B Natriuretic Peptide: 597.6 pg/mL — ABNORMAL HIGH (ref 0.0–100.0)

## 2021-05-31 LAB — RESP PANEL BY RT-PCR (FLU A&B, COVID) ARPGX2
Influenza A by PCR: NEGATIVE
Influenza B by PCR: NEGATIVE
SARS Coronavirus 2 by RT PCR: NEGATIVE

## 2021-05-31 LAB — HEMOGLOBIN AND HEMATOCRIT, BLOOD
HCT: 39.6 % (ref 36.0–46.0)
Hemoglobin: 12.5 g/dL (ref 12.0–15.0)

## 2021-05-31 LAB — MAGNESIUM: Magnesium: 1.8 mg/dL (ref 1.7–2.4)

## 2021-05-31 LAB — HEPARIN LEVEL (UNFRACTIONATED)
Heparin Unfractionated: 0.67 IU/mL (ref 0.30–0.70)
Heparin Unfractionated: 0.67 IU/mL (ref 0.30–0.70)

## 2021-05-31 LAB — APTT: aPTT: 26 seconds (ref 24–36)

## 2021-05-31 MED ORDER — METOPROLOL SUCCINATE ER 100 MG PO TB24
100.0000 mg | ORAL_TABLET | Freq: Every day | ORAL | Status: DC
  Administered 2021-05-31 – 2021-06-02 (×3): 100 mg via ORAL
  Filled 2021-05-31 (×3): qty 1

## 2021-05-31 MED ORDER — ACETAMINOPHEN 325 MG PO TABS
650.0000 mg | ORAL_TABLET | Freq: Four times a day (QID) | ORAL | Status: DC | PRN
  Administered 2021-05-31: 08:00:00 650 mg via ORAL
  Filled 2021-05-31: qty 2

## 2021-05-31 MED ORDER — ATORVASTATIN CALCIUM 10 MG PO TABS
20.0000 mg | ORAL_TABLET | Freq: Every day | ORAL | Status: DC
Start: 2021-05-31 — End: 2021-06-02
  Administered 2021-05-31 – 2021-06-02 (×3): 20 mg via ORAL
  Filled 2021-05-31 (×3): qty 2

## 2021-05-31 MED ORDER — ONDANSETRON HCL 4 MG/2ML IJ SOLN
4.0000 mg | Freq: Four times a day (QID) | INTRAMUSCULAR | Status: DC | PRN
  Administered 2021-05-31: 4 mg via INTRAVENOUS
  Filled 2021-05-31: qty 2

## 2021-05-31 MED ORDER — SODIUM CHLORIDE 0.9% FLUSH
3.0000 mL | Freq: Two times a day (BID) | INTRAVENOUS | Status: DC
  Administered 2021-05-31 – 2021-06-01 (×2): 3 mL via INTRAVENOUS

## 2021-05-31 MED ORDER — SIMVASTATIN 20 MG PO TABS: 40.0000 mg | ORAL_TABLET | Freq: Every evening | ORAL | Status: DC

## 2021-05-31 MED ORDER — ALBUTEROL SULFATE (2.5 MG/3ML) 0.083% IN NEBU: 2.5000 mg | INHALATION_SOLUTION | Freq: Four times a day (QID) | RESPIRATORY_TRACT | Status: DC | PRN

## 2021-05-31 MED ORDER — DILTIAZEM HCL-DEXTROSE 125-5 MG/125ML-% IV SOLN (PREMIX)
5.0000 mg/h | INTRAVENOUS | Status: DC
  Administered 2021-05-31: 5 mg/h via INTRAVENOUS
  Filled 2021-05-31: qty 125

## 2021-05-31 MED ORDER — HEPARIN (PORCINE) 25000 UT/250ML-% IV SOLN
900.0000 [IU]/h | INTRAVENOUS | Status: DC
  Administered 2021-05-31 – 2021-06-01 (×2): 900 [IU]/h via INTRAVENOUS
  Filled 2021-05-31 (×2): qty 250

## 2021-05-31 MED ORDER — SODIUM CHLORIDE 0.9% FLUSH
3.0000 mL | Freq: Two times a day (BID) | INTRAVENOUS | Status: DC
  Administered 2021-06-01: 3 mL via INTRAVENOUS

## 2021-05-31 MED ORDER — ASPIRIN EC 81 MG PO TBEC
81.0000 mg | DELAYED_RELEASE_TABLET | Freq: Every day | ORAL | Status: DC
Start: 2021-05-31 — End: 2021-06-02
  Administered 2021-05-31 – 2021-06-02 (×3): 81 mg via ORAL
  Filled 2021-05-31 (×3): qty 1

## 2021-05-31 MED ORDER — ONDANSETRON HCL 4 MG PO TABS: 4.0000 mg | ORAL_TABLET | Freq: Four times a day (QID) | ORAL | Status: DC | PRN

## 2021-05-31 MED ORDER — POTASSIUM CHLORIDE CRYS ER 20 MEQ PO TBCR
50.0000 meq | EXTENDED_RELEASE_TABLET | ORAL | Status: AC
  Administered 2021-05-31: 50 meq via ORAL
  Filled 2021-05-31: qty 1

## 2021-05-31 MED ORDER — POTASSIUM CHLORIDE CRYS ER 20 MEQ PO TBCR
40.0000 meq | EXTENDED_RELEASE_TABLET | Freq: Once | ORAL | Status: AC
  Administered 2021-05-31: 40 meq via ORAL
  Filled 2021-05-31: qty 2

## 2021-05-31 MED ORDER — DILTIAZEM LOAD VIA INFUSION
10.0000 mg | Freq: Once | INTRAVENOUS | Status: AC
  Administered 2021-05-31: 10 mg via INTRAVENOUS
  Filled 2021-05-31: qty 10

## 2021-05-31 MED ORDER — ACETAMINOPHEN 650 MG RE SUPP
650.0000 mg | Freq: Four times a day (QID) | RECTAL | Status: DC | PRN
Start: 2021-05-31 — End: 2021-06-02

## 2021-05-31 MED ORDER — AMIODARONE HCL IN DEXTROSE 360-4.14 MG/200ML-% IV SOLN
30.0000 mg/h | INTRAVENOUS | Status: DC
  Administered 2021-05-31 (×3): 30 mg/h via INTRAVENOUS
  Filled 2021-05-31 (×3): qty 200

## 2021-05-31 MED ORDER — AMIODARONE HCL IN DEXTROSE 360-4.14 MG/200ML-% IV SOLN
60.0000 mg/h | INTRAVENOUS | Status: AC
  Administered 2021-05-31 (×2): 60 mg/h via INTRAVENOUS
  Filled 2021-05-31: qty 200

## 2021-05-31 MED ORDER — AMIODARONE LOAD VIA INFUSION
150.0000 mg | Freq: Once | INTRAVENOUS | Status: AC
  Administered 2021-05-31: 150 mg via INTRAVENOUS
  Filled 2021-05-31: qty 83.34

## 2021-05-31 MED ORDER — HEPARIN BOLUS VIA INFUSION
3000.0000 [IU] | Freq: Once | INTRAVENOUS | Status: AC
  Administered 2021-05-31: 3000 [IU] via INTRAVENOUS
  Filled 2021-05-31: qty 3000

## 2021-05-31 MED ORDER — MAGNESIUM SULFATE 2 GM/50ML IV SOLN
2.0000 g | Freq: Once | INTRAVENOUS | Status: AC
  Administered 2021-05-31: 2 g via INTRAVENOUS
  Filled 2021-05-31: qty 50

## 2021-05-31 NOTE — ED Notes (Signed)
Interpreter will be here in 15 minutes

## 2021-05-31 NOTE — Assessment & Plan Note (Addendum)
-  Home medication regimen includes metformin.  Last available hemoglobin A1c from 4 years ago was 6.4. -Hold metformin given that she is getting a cardiac cath and hold for at least 48 hours until after -Hemoglobin A1c is now 6.8 -Continue monitor CBGs per protocol and if necessary will place on sensitive neurologic sign scale insulin AC

## 2021-05-31 NOTE — Assessment & Plan Note (Addendum)
-  Last EF noted to be 60 to 65% with grade 3 diastolic function severe mitral regurgitation, moderate to severe tricuspid regurgitation, and pulmonary hypertension with. -Strict I&Os; patient is +695 mL since admission -Daily weights -The patient's BNP on admission was 597.6 -Cardiology is following and defer diuresis to them given that her renal function is slowly trending up

## 2021-05-31 NOTE — ED Notes (Signed)
Declined video interpreter. Requested in person translator. Services called but state no persons available and that they will continue to search. Meantime communicating by writing on paper.

## 2021-05-31 NOTE — Progress Notes (Signed)
°   05/31/21 1449  Clinical Encounter Type  Visited With Patient;Family;Health care provider  Visit Type Pre-op;Initial  Referral From Nurse  Consult/Referral To Chaplain  Recommendations Patient requires sign language interpreter at time of signing   Chaplain Melvenia Beam met with Maria Collier, along with her daughter at patient's bedside regarding the completion of Advance Directive. Patient's daughter served as Astronomer during A.D. Education. Through interpreter patient acknowledged that preparing A.D. was something important that she would like to have completed. Chaplain explained that A.D. is only used when the patient cannot make medical decisions about medical care for herself, giving instruction to the doctors and her agents of how she wishes to be cared.   Patient stated after careful thought that she plans to name her daughters as her Medical Power of Attorney.    Chaplain explained the importance of having conversation with her daughters prior to naming them as her agents.    Chaplain provided patient a blank copy and provided patient instruction for the completion of Parts A and B of the A.D. Chaplain instructed patient to have the nurse contact [place] Department when she has complete Parts A and B, so that we can arrange for Notary and witnesses to assist with completion of Part C. Patient will have a medical interpreter available tomorrow at the time of signing. Patient was provided chaplains phone number and will call when A.D. is completed and interpreter is present.  Patient was appreciative of the consultation. Chaplain will follow up with patient to arrange for witnesses and Notary assistance. [place], M. [place]. may be contacted at [phone] for further assistance.

## 2021-05-31 NOTE — Assessment & Plan Note (Addendum)
-  Acute.   -Patient reports having a squeezing chest pain starting last night, but had had on initial high-sensitivity troponin 29-> repeat 39.  -Suspect symptoms possibly secondary to an in setting of rapid A-fib. -Continue to monitor closely and cardiology is following; she is already on aspirin 81 mg p.o. daily, atorvastatin 20 g p.o. daily as well as metoprolol succinate 100 minutes p.o. daily -Further care per cardiology and she had normal coronaries noted on her cardiac catheterization yesterday and will follow-up with them in outpatient setting

## 2021-05-31 NOTE — Progress Notes (Addendum)
CSW received consult for advanced directives for patient. CSW reached out to chaplain. Chaplain confirmed he is going to stop by to see patient.

## 2021-05-31 NOTE — Assessment & Plan Note (Addendum)
-  Continue atorvastatin 20 mg p.o. daily

## 2021-05-31 NOTE — Progress Notes (Signed)
ANTICOAGULATION CONSULT NOTE - Initial Consult  Pharmacy Consult for heparin Indication: atrial fibrillation  Allergies  Allergen Reactions   Altace [Ramipril] Cough    COUGH   Hyzaar [Losartan Potassium-Hctz] Cough    COUGH   Hydrocodone Cough    COUGH     Patient Measurements: Height: 5\' 2"  (157.5 cm) Weight: 57.4 kg (126 lb 9.6 oz) IBW/kg (Calculated) : 50.1  Vital Signs: Temp: 97.7 F (36.5 C) (02/22 0144) Temp Source: Oral (02/22 0144) BP: 110/60 (02/22 0144) Pulse Rate: 115 (02/22 0144)  Labs: Recent Labs    05/29/21 0754  HGB 12.6  HCT 38.3  PLT 189  CREATININE 1.09*    Estimated Creatinine Clearance: 29.8 mL/min (A) (by C-G formula based on SCr of 1.09 mg/dL (H)).   Medical History: Past Medical History:  Diagnosis Date   Deafness 1939   following meningitis   Diabetes mellitus without complication ([place])    Dyspnea    at night if the house is hot   Family history of breast cancer    Family history of colonic polyps    Family history of ovarian cancer    GERD (gastroesophageal reflux disease)    occasionally   Headache    sometimes   Hyperlipidemia    Hypertension    Meningitis    Mitral regurgitation    severe by echo 04/2021   Osteoporosis    Pneumonia    yrs. ago   Pulmonary HTN (HCC)    PASP in the 50's by echo 04/2021   Pulmonic regurgitation    moderate by echo 04/2021   Tricuspid regurgitation    moderate to severe by echo 04/2021    Assessment: 88yo female c/o dizziness and palpitations, EMS found pt to be in Afib w/ RVR with rate to 160s >> to begin heparin.  Goal of Therapy:  Heparin level 0.3-0.7 units/ml Monitor platelets by anticoagulation protocol: Yes   Plan:  Heparin 3000 units IV bolus x1 followed by infusion at 900 units/hr and monitor heparin levels and CBC.  Wynona Neat, PharmD, BCPS  05/31/2021,2:27 AM

## 2021-05-31 NOTE — Assessment & Plan Note (Addendum)
--  Acute.  Hemoglobin 10.9 on admission, but previously had been 12.6 g/dL 2 days ago.  Patient denies any reports of bleeding.  She had been started on a heparin drip, but labs were obtained prior to doing so. -Repeat hemoglobin/hematocrit has improved and is now stable at 10.2/30.0 with it being 10.4/31.9 yesterday AM and today it is relatively stable at 10.5/32.9 -Continue to monitor for signs and symptoms of bleeding; currently no overt bleeding noted -Repeat CBC in a.m.

## 2021-05-31 NOTE — Assessment & Plan Note (Addendum)
-  On admission creatinine 1.18 with BUN 32.  -Baseline creatinine previously noted to be around 0.9-1.  -Has a slight AKI in the setting of her atrial fibrillation and BUN/creatinine is slightly worsened to 32/1.35 and trended up from admission and is now improved and is 26/1.16 -Currently she is getting KVO fluids and will need to come to monitor renal function carefully -Avoid further nephrotoxic medications, hypotension and dehydration; she will be undergoing a cardiac catheterization which may worsen her renal function -Continue monitor renal function carefully and trend daily

## 2021-05-31 NOTE — TOC Benefit Eligibility Note (Signed)
Patient Teacher, English as a foreign language completed.    The patient is currently admitted and upon discharge could be taking Xarelto 15 mg.  The current 30 day co-pay is, $47.00.   The patient is currently admitted and upon discharge could be taking Eliquis 2.5 mg.  The current 30 day co-pay is, $47.00.   The patient is insured through [place], [place] Patient Advocate Specialist [place] Patient Advocate Team Direct Number: [phone]  Fax: [phone]

## 2021-05-31 NOTE — Assessment & Plan Note (Addendum)
-  Severe mitral regurgitation, moderate to severe tricuspid regurgitation, and pulmonary artery hypertension with estimated RVSP 56.3 mmHg by echocardiogram from 04/2021.based off last echo from 04/2021.   -She has been being followed by cardiology with plans for TEE and right and left heart catheterization study for further evaluation for need of transcatheter mitral edge-to-edge repair. -Further care per cardiology and they are arranging for the right and left heart cath today to determine further steps -Patient is being arranged for TEE in the outpatient setting as she is converted to normal sinus rhythm on the amiodarone -Cath showed a left ventricular end-diastolic pressure that was normal and a normal coronary artery anatomy no normal left ventricular filling pressures with PCP a BP of 70 mm per mercury and normal right heart Pressure with PAP of a mean of 17 mm per mercury.  She had a normal cardiac output index of 3.03 -We will defer further care to Cardiology recommendations and they are recommending outpatient TEE early next week and continue amiodarone taper as written.

## 2021-05-31 NOTE — Progress Notes (Signed)
ANTICOAGULATION CONSULT NOTE  Pharmacy Consult for heparin Indication: atrial fibrillation  Allergies  Allergen Reactions   Altace [Ramipril] Cough    COUGH   Hyzaar [Losartan Potassium-Hctz] Cough    COUGH   Hydrocodone Cough    COUGH     Patient Measurements: Height: 5\' 2"  (157.5 cm) Weight: 57.4 kg (126 lb 9.6 oz) IBW/kg (Calculated) : 50.1  Vital Signs: Temp: 97.6 F (36.4 C) (02/22 1108) Temp Source: Oral (02/22 1108) BP: 106/71 (02/22 1108) Pulse Rate: 125 (02/22 1108)  Labs: Recent Labs    05/29/21 0754 05/31/21 0226 05/31/21 0400 05/31/21 0844 05/31/21 1100  HGB 12.6 10.9*  --  12.5  --   HCT 38.3 34.6*  --  39.6  --   PLT 189 161  --   --   --   APTT  --  26  --   --   --   LABPROT  --  13.3  --   --   --   INR  --  1.0  --   --   --   HEPARINUNFRC  --   --   --   --  0.67  CREATININE 1.09* 1.18*  --   --   --   TROPONINIHS  --  29* 39*  --   --      Estimated Creatinine Clearance: 27.6 mL/min (A) (by C-G formula based on SCr of 1.18 mg/dL (H)).   Medical History: Past Medical History:  Diagnosis Date   Deafness 1939   following meningitis   Diabetes mellitus without complication ([place])    Dyspnea    at night if the house is hot   Family history of breast cancer    Family history of colonic polyps    Family history of ovarian cancer    GERD (gastroesophageal reflux disease)    occasionally   Headache    sometimes   Hyperlipidemia    Hypertension    Meningitis    Mitral regurgitation    severe by echo 04/2021   Osteoporosis    Pneumonia    yrs. ago   Pulmonary HTN (HCC)    PASP in the 50's by echo 04/2021   Pulmonic regurgitation    moderate by echo 04/2021   Tricuspid regurgitation    moderate to severe by echo 04/2021    Assessment: 88yo female c/o dizziness and palpitations, EMS found pt to be in Afib w/ RVR with rate to 160s >> to begin heparin. No AC PTA.  Heparin level came back therapeutic at higher end of goal range  (0.67), on 900 units/hr after bolus. Hgb 10.9, plt 161. No s/sx of bleeding or infusion issues.   Goal of Therapy:  Heparin level 0.3-0.7 units/ml Monitor platelets by anticoagulation protocol: Yes   Plan:  Continue heparin infusion at 900 units/hr  Order heparin level in 8 hours  Monitor heparin levels and CBC.  Antonietta Jewel, PharmD, BCCCP Clinical Pharmacist  Phone: [phone] 05/31/2021 11:41 AM  Please check AMION for all [place] phone numbers After 10:00 PM, call [place] [phone]

## 2021-05-31 NOTE — Assessment & Plan Note (Deleted)
Moderate to severe by echocardiogram from 04/2021.

## 2021-05-31 NOTE — ED Provider Notes (Signed)
[facility] EMERGENCY DEPARTMENT Provider Note   CSN: [id number] Arrival date & time: 05/31/21  0138     History  Chief Complaint  Patient presents with   Atrial Fibrillation    Maria Collier is a 88 y.o. female.  Patient presents to the emergency department for evaluation of dizziness and feeling like she is going to pass out.  She has been experiencing palpitations.  Information provided by EMS.  Patient is deaf and is refusing to interact with our interpreter.  No family available.      Home Medications Prior to Admission medications   Medication Sig Start Date End Date Taking? Authorizing Provider  acetaminophen (TYLENOL) 500 MG tablet Take 500 mg by mouth daily as needed for mild pain or headache.    [provider]  amLODipine (NORVASC) 5 MG tablet Take 5 mg by mouth daily. 09/22/20   [provider]  aspirin 81 MG tablet Take 81 mg by mouth at bedtime.     [provider]  Cholecalciferol (VITAMIN D) 50 MCG (2000 UT) CAPS Take 2,000 Units by mouth daily.    [provider]  Cyanocobalamin (VITAMIN B12 TR) 2000 MCG TBCR 1 tablet    [provider]  erythromycin ophthalmic ointment APPLY TO RIGHT LOWER LID TWICE A DAY FOR 2 WEEKS 06/10/17   [provider]  folic acid (FOLVITE) 675 MCG tablet Take 800 mcg by mouth daily.    [provider]  furosemide (LASIX) 20 MG tablet Take 1 tablet (20 mg total) by mouth daily. 05/30/21   Early Osmond, MD  hyaluronate sodium (RADIAPLEXRX) GEL Apply 1 application topically 2 (two) times daily.    [provider]  hydrochlorothiazide (HYDRODIURIL) 25 MG tablet Take 25 mg by mouth daily.    [provider]  metFORMIN (GLUCOPHAGE-XR) 500 MG 24 hr tablet Take 500 mg by mouth every evening.    [provider]  metoprolol succinate (TOPROL-XL) 100 MG 24 hr tablet Take 100 mg by mouth daily. Take with or immediately following a meal.     [provider]  Misc Natural Products (ELDERBERRY ZINC/VIT C/IMMUNE MT) 1 gummy    [provider]  Omega-3 Fatty Acids (FISH OIL PO) 1 gummy    [provider]  Polyethyl Glycol-Propyl Glycol (SYSTANE OP) Apply 1 drop to eye 4 (four) times daily as needed (dry eyes).    [provider]  simvastatin (ZOCOR) 40 MG tablet Take 40 mg by mouth every evening.     [provider]  vitamin C (ASCORBIC ACID) 500 MG tablet Take 500 mg by mouth daily.    [provider]  vitamin E 1000 UNIT capsule Take 1,000 Units by mouth daily.    [provider]      Allergies    Altace [ramipril], Hyzaar [losartan potassium-hctz], and Hydrocodone    Review of Systems   Review of Systems  Unable to perform ROS: Patient nonverbal   Physical Exam Updated Vital Signs BP (!) 112/100    Pulse (!) 139    Temp 97.7 F (36.5 C) (Oral)    Resp 16    Ht 5\' 2"  (1.575 m)    Wt 57.4 kg    SpO2 99%    BMI 23.16 kg/m  Physical Exam Vitals and nursing note reviewed.  Constitutional:      General: She is not in acute distress.    Appearance: She is well-developed.  HENT:  Head: Normocephalic and atraumatic.     Mouth/Throat:     Mouth: Mucous membranes are moist.  Eyes:     General: Vision grossly intact. Gaze aligned appropriately.     Extraocular Movements: Extraocular movements intact.     Conjunctiva/sclera: Conjunctivae normal.  Cardiovascular:     Rate and Rhythm: Normal rate. Rhythm irregular.     Pulses: Normal pulses.     Heart sounds: Normal heart sounds, S1 normal and S2 normal. No murmur heard.   No friction rub. No gallop.  Pulmonary:     Effort: Pulmonary effort is normal. No respiratory distress.     Breath sounds: Normal breath sounds.  Abdominal:     General: Bowel sounds are normal.     Palpations: Abdomen is soft.     Tenderness: There is no abdominal tenderness. There is no guarding or rebound.     Hernia: No hernia is  present.  Musculoskeletal:        General: No swelling.     Cervical back: Full passive range of motion without pain, normal range of motion and neck supple. No spinous process tenderness or muscular tenderness. Normal range of motion.     Right lower leg: No edema.     Left lower leg: No edema.  Skin:    General: Skin is warm and dry.     Capillary Refill: Capillary refill takes less than 2 seconds.     Findings: No ecchymosis, erythema, rash or wound.  Neurological:     General: No focal deficit present.     Mental Status: She is alert and oriented to person, place, and time.     GCS: GCS eye subscore is 4. GCS verbal subscore is 5. GCS motor subscore is 6.     Cranial Nerves: Cranial nerves 2-12 are intact.     Sensory: Sensation is intact.     Motor: Motor function is intact.     Coordination: Coordination is intact.  Psychiatric:        Attention and Perception: Attention normal.        Mood and Affect: Mood normal.        Speech: Speech normal.        Behavior: Behavior normal.    ED Results / Procedures / Treatments   Labs (all labs ordered are listed, but only abnormal results are displayed) Labs Reviewed  CBC WITH DIFFERENTIAL/PLATELET - Abnormal; Notable for the following components:      Result Value   RBC 3.51 (*)    Hemoglobin 10.9 (*)    HCT 34.6 (*)    All other components within normal limits  BASIC METABOLIC PANEL - Abnormal; Notable for the following components:   Potassium 3.1 (*)    Chloride 95 (*)    Glucose, Bld 177 (*)    BUN 32 (*)    Creatinine, Ser 1.18 (*)    GFR, Estimated 45 (*)    All other components within normal limits  BRAIN NATRIURETIC PEPTIDE - Abnormal; Notable for the following components:   B Natriuretic Peptide 597.6 (*)    All other components within normal limits  TROPONIN I (HIGH SENSITIVITY) - Abnormal; Notable for the following components:   Troponin I (High Sensitivity) 29 (*)    All other components within normal limits   TROPONIN I (HIGH SENSITIVITY) - Abnormal; Notable for the following components:   Troponin I (High Sensitivity) 39 (*)    All other components within normal limits  RESP PANEL  BY RT-PCR (FLU A&B, COVID) ARPGX2  PROTIME-INR  APTT  HEPARIN LEVEL (UNFRACTIONATED)    EKG EKG Interpretation  Date/Time:  Wednesday May 31 2021 01:57:48 EST Ventricular Rate:  137 PR Interval:  212 QRS Duration: 125 QT Interval:  326 QTC Calculation: 493 R Axis:   -71 Text Interpretation: Atrial fibrillation with rapid ventricular response RBBB and LAFB Left ventricular hypertrophy Abnormal T, consider ischemia, lateral leads since last tracing, AFIB has replaced SR Confirmed by Orpah Greek [phone]) on 05/31/2021 2:27:31 AM  Radiology DG Chest Port 1 View  Result Date: 05/31/2021 CLINICAL DATA:  Chest pain. EXAM: PORTABLE CHEST 1 VIEW COMPARISON:  May 02, 2010 FINDINGS: There is moderate to marked severity enlargement of the cardiac silhouette which is increased in severity when compared to the prior study. Marked severity curvilinear cardiac related calcification is seen overlying the medial aspect of the left lung base. Mildly decreased lung volumes are noted. Both lungs are clear. Multilevel degenerative changes are seen throughout the thoracic spine. IMPRESSION: Cardiomegaly, as described above, without acute cardiopulmonary disease. Electronically Signed   By: Virgina Norfolk M.D.   On: 05/31/2021 02:54    Procedures Procedures    Medications Ordered in ED Medications  diltiazem (CARDIZEM) 1 mg/mL load via infusion 10 mg (10 mg Intravenous Bolus from Bag 05/31/21 0306)    And  diltiazem (CARDIZEM) 125 mg in dextrose 5% 125 mL (1 mg/mL) infusion (10 mg/hr Intravenous Rate/Dose Change 05/31/21 0444)  heparin ADULT infusion 100 units/mL (25000 units/238mL) (900 Units/hr Intravenous New Bag/Given 05/31/21 0401)  heparin bolus via infusion 3,000 Units (3,000 Units Intravenous Bolus from  Bag 05/31/21 0401)  potassium chloride SA (KLOR-CON M) CR tablet 40 mEq (40 mEq Oral Given 05/31/21 9735)    ED Course/ Medical Decision Making/ A&P       CHA2DS2-VASc Score: 5                    Medical Decision Making Amount and/or Complexity of Data Reviewed Labs: ordered. Radiology: ordered.  Risk Prescription drug management. Decision regarding hospitalization.   Patient presents to the emergency department for evaluation of heart palpitations.  Patient information is limited because she is deaf and we do not have an American sign language interpreter available.  She is refusing to use the video interpreter.  She has shared some limited information by writing.  Patient found to be in atrial fibrillation with rapid ventricular response.  I cannot find any history of this in her chart.  She does have a history of severe mitral and tricuspid regurgitation.  She does not appear to have any overt heart failure at this time.  Was initiated on Cardizem with some improvement of her heart rate.  Troponins are slightly elevated.  Discussed with cardiology who agrees with current plan.  Initiated on heparin.  Cardiology will see the patient this morning formally.   CHA2DS2/VAS Stroke Risk Points  Current as of 48 minutes ago     5 >= 2 Points: High Risk  1 - 1.99 Points: Medium Risk  0 Points: Low Risk    No Change      Details    This score determines the patient's risk of having a stroke if the  patient has atrial fibrillation.       Points Metrics  0 Has Congestive Heart Failure:  No    Current as of 48 minutes ago  0 Has Vascular Disease:  No    Current as of  48 minutes ago  1 Has Hypertension:  Yes    Current as of 48 minutes ago  2 Age:  18    Current as of 48 minutes ago  1 Has Diabetes:  Yes    Current as of 48 minutes ago  0 Had Stroke:  No  Had TIA:  No  Had Thromboembolism:  No    Current as of 48 minutes ago  1 Female:  Yes    Current as of 48 minutes ago                      Final Clinical Impression(s) / ED Diagnoses Final diagnoses:  Atrial fibrillation with RVR ([place])    Rx / DC Orders ED Discharge Orders     None         Orpah Greek, MD 05/31/21 [phone]

## 2021-05-31 NOTE — Assessment & Plan Note (Addendum)
-  Blood pressures appear to be currently maintained 92/62-126/68.   -Home blood pressure regimen includes amlodipine 5 mg daily, furosemide 20 mg twice daily, metoprolol 100 mg daily.  Resume home medications with amlodipine and furosemide 20 mg daily as needed as well as hydrochlorothiazide at discharge per cardiology recommendation -Held current home regimen and will defer to cardiology for further recommendations -Last blood pressure reading was on the softer side at 163/65

## 2021-05-31 NOTE — ED Triage Notes (Signed)
Pt bib [facility] from home c/o dizziness, feeling faint, and palpitations. Initially pt was afib rvr from 140-166. 10mg  total of cardizem.   EMS vitals 112/70 BP 97% O2 120-166 HR

## 2021-05-31 NOTE — Assessment & Plan Note (Addendum)
-  Acute.  On admission potassium noted to be 3.1 and is now improved after potassium supplementation is now 4.6 -Continue to monitor and replace as needed -Check magnesium level and last was 1.9 -Repeat CMP in the a.m.

## 2021-05-31 NOTE — H&P (Addendum)
History and Physical    Patient: Maria Collier DVV:[id number] DOB: [birth year] DOA: 05/31/2021 DOS: the patient was seen and examined on 05/31/2021 PCP: Kelton Pillar, MD  Patient coming from: Home via EMS  Chief Complaint:  Chief Complaint  Patient presents with   Dizziness and chest pain    HPI: Maria Collier is a 88 y.o. female with medical history significant of hypertension, hyperlipidemia, diastolic congestive heart failure, severe mitral regurgitation, tricuspid regurgitation, pulmonary hypertension, diabetes mellitus type 2, and GERD presents with complaints of dizziness and chest pain.  History is obtained with use of some language services.  She was about to take a bath last night around 11:30 PM when she reported feeling funny.  Complained of having squeezing centralized chest pain and broke out into a sweat.  She almost fell due to the dizziness, but was able to catch her self and was able to sit down.  She had placed a cool rag on her forehead and was able to call 911.  Associated symptoms include shortness of breath, cough, nausea, and orthopnea.  Ms. Regnier states since the beginning of this year she has noticed that she has been short of breath with activity and at rest.  She has to rest a couple times when walking any distance due to feeling out of breath.  The cough has been present intermittently from time to time and is not necessarily new.  She chronically has left leg swelling, but had not noticed any significant change.  Her weight has usually been been stable at around 133 pounds, but she is down to about 126 pounds last time she checked which she felt was usual as she denies any changes in her diet.  Denies having any recent fever, loss of consciousness, abdominal pain, palpitations, blood in stools/urine, or sick contacts to her knowledge.  She reports that she has had some intermittent chest discomfort before, but symptoms were never this severe for which she felt like  she could not  She is followed by cardiology in the outpatient setting and had been in the process of being worked up for her valve issues.  Last seen in the office on 2/1 where plan was for patient to have TEE with left and right heart catheterization for further evaluation.  During this visit.  She was also started on furosemide 20 mg twice daily which she had been taking as advised until she was called last night and advised to only take once daily.  In route with EMS patient was noted to be in atrial fibrillation with RVR with heart rates 140-166.   Cardizem 10 mg IV given prior to arrival.  Upon arrival into the emergency department patient was noted to atrial fibrillation heart rates 10 5-134, respirations 16-27, and all other vital signs maintained.  Labs significant for hemoglobin 10.9, potassium 3.1, chloride 95,  BUN 32, creatinine 1.18, BNP 597.6, and high-sensitivity troponin 29->39.  Influenza and COVID-19 screening were negative.  Chest x-ray noted increased cardiomegaly without acute cardiopulmonary disease.  Patient has been started o diltiazem drip, heparin drip, and given potassium chloride 40 mEq p.o.  TRH called to admit.   Review of Systems: As mentioned in the history of present illness. All other systems reviewed and are negative. Past Medical History:  Diagnosis Date   Deafness 1939   following meningitis   Diabetes mellitus without complication ([place])    Dyspnea    at night if the house is hot   Family history  of breast cancer    Family history of colonic polyps    Family history of ovarian cancer    GERD (gastroesophageal reflux disease)    occasionally   Headache    sometimes   Hyperlipidemia    Hypertension    Meningitis    Mitral regurgitation    severe by echo 04/2021   Osteoporosis    Pneumonia    yrs. ago   Pulmonary HTN ([place])    PASP in the 50's by echo 04/2021   Pulmonic regurgitation    moderate by echo 04/2021   Tricuspid regurgitation    moderate  to severe by echo 04/2021   Past Surgical History:  Procedure Laterality Date   ABDOMINAL HYSTERECTOMY     BREAST LUMPECTOMY WITH RADIOACTIVE SEED LOCALIZATION Left 05/15/2017   Procedure: BREAST LUMPECTOMY WITH RADIOACTIVE SEED LOCALIZATION;  Surgeon: Fanny Skates, MD;  Location: [facility];  Service: General;  Laterality: Left;   Social History:  reports that she has never smoked. She has never used smokeless tobacco. She reports that she does not drink alcohol and does not use drugs.  Allergies  Allergen Reactions   Altace [Ramipril] Cough    COUGH   Hyzaar [Losartan Potassium-Hctz] Cough    COUGH   Hydrocodone Cough    COUGH     Family History  Problem Relation Age of Onset   Ovarian cancer Daughter        87s   Breast cancer Daughter 50   Heart disease Father    Cancer Paternal Grandmother        ? stomach cancer   Cancer Paternal Grandfather    Other Sister        d. 3s, had stomach issues, e.g. ulcers   Hypertension Brother    Colon polyps Daughter        6 polyps on first colonoscopy    Prior to Admission medications   Medication Sig Start Date End Date Taking? Authorizing Provider  acetaminophen (TYLENOL) 500 MG tablet Take 500 mg by mouth daily as needed for mild pain or headache.    [provider]  amLODipine (NORVASC) 5 MG tablet Take 5 mg by mouth daily. 09/22/20   [provider]  aspirin 81 MG tablet Take 81 mg by mouth at bedtime.     [provider]  Cholecalciferol (VITAMIN D) 50 MCG (2000 UT) CAPS Take 2,000 Units by mouth daily.    [provider]  Cyanocobalamin (VITAMIN B12 TR) 2000 MCG TBCR 1 tablet    [provider]  erythromycin ophthalmic ointment APPLY TO RIGHT LOWER LID TWICE A DAY FOR 2 WEEKS 06/10/17   [provider]  folic acid (FOLVITE) 782 MCG tablet Take 800 mcg by mouth daily.    [provider]  furosemide (LASIX) 20 MG tablet Take 1 tablet (20 mg total) by mouth daily. 05/30/21    Early Osmond, MD  hyaluronate sodium (RADIAPLEXRX) GEL Apply 1 application topically 2 (two) times daily.    [provider]  hydrochlorothiazide (HYDRODIURIL) 25 MG tablet Take 25 mg by mouth daily.    [provider]  metFORMIN (GLUCOPHAGE-XR) 500 MG 24 hr tablet Take 500 mg by mouth every evening.    [provider]  metoprolol succinate (TOPROL-XL) 100 MG 24 hr tablet Take 100 mg by mouth daily. Take with or immediately following a meal.    [provider]  Misc Natural Products (ELDERBERRY ZINC/VIT C/IMMUNE MT) 1 gummy  [provider]  Omega-3 Fatty Acids (FISH OIL PO) 1 gummy    [provider]  Polyethyl Glycol-Propyl Glycol (SYSTANE OP) Apply 1 drop to eye 4 (four) times daily as needed (dry eyes).    [provider]  simvastatin (ZOCOR) 40 MG tablet Take 40 mg by mouth every evening.     [provider]  vitamin C (ASCORBIC ACID) 500 MG tablet Take 500 mg by mouth daily.    [provider]  vitamin E 1000 UNIT capsule Take 1,000 Units by mouth daily.    [provider]    Physical Exam: Vitals:   05/31/21 0345 05/31/21 0415 05/31/21 0500 05/31/21 0545  BP: 126/68 107/66 112/70 100/77  Pulse: (!) 121 (!) 133 (!) 134 (!) 115  Resp: (!) 22 (!) 21 (!) 27 19  Temp:      TempSrc:      SpO2: 100% 98% 97% 100%  Weight:      Height:        Constitutional: Elderly female currently no acute distress anxious Eyes: PERRL, lids and conjunctivae normal ENMT: Mucous membranes are moist. Posterior pharynx clear of any exudate or lesions.  Neck: normal, supple, no masses.  JVD appears present Respiratory: clear to auscultation bilaterally, no wheezing, no crackles. Normal respiratory effort.   Cardiovascular: Irregular irregular and tachycardic with positive murmur present.  No significant lower extremity edema. Abdomen: no abdominal tenderness.  Bowel sounds positive.  Musculoskeletal: no  clubbing / cyanosis. No joint deformity upper and lower extremities.  Normal muscle tone.  Skin: no rashes, lesions, ulcers. No induration Neurologic: CN 2-12 grossly intact. Strength 5/5 in all 4.  Psychiatric: Normal judgment and insight. Alert and oriented x 3. Normal mood.    Data Reviewed:   EKG noted atrial fibrillation with RVR at 137 bpm with signs of LVH abnormal T waves in the lateral leads  Assessment and Plan: * Atrial fibrillation with RVR ([place])- (present on admission) Acute.  Patient presented in atrial fibrillation with heart rates into the 160s.  She had received Cardizem 10 mg IV in route and started on a Cardizem drip while in the ED. CHA2DS2-VASc score equal to at least 5 based off age, sex, HTN, and DM type II.  She had been started on a Cardizem and heparin drip.  Cardiology have been formally consulted. -Admit to a progressive bed -Continue heparin per pharmacy -Continue Cardizem drip as tolerated, but may need to be transition to Cardizem given soft blood pressures -Check TSH -Goal to maintain at least potassium 4 and magnesium 2.  Will replace as needed. -[place] cardiology consultative services, will follow-up for further recommendations  Elevated troponin and chest pain- (present on admission) Acute.  Patient reports having a squeezing chest pain starting last night, but had had on initial high-sensitivity troponin 29-> repeat 39.  Suspect symptoms possibly secondary to an in setting of rapid A-fib. -Continue to monitor  Severe mitral regurgitation  tricuspid regurgitation  pulmonary artery hypertension- (present on admission) Severe mitral regurgitation, moderate to severe tricuspid regurgitation, and pulmonary artery hypertension with estimated RVSP 56.3 mmHg by echocardiogram from 04/2021.based off last echo from 04/2021.  She has been being followed by cardiology with plans for TEE and right and left heart catheterization study for further evaluation for need  of transcatheter mitral edge-to-edge repair. -Per cardiology  Normocytic anemia- (present on admission) Acute.  Hemoglobin 10.9 on admission, but previously had been 12.6 g/dL 2 days ago.  Patient denies any reports  of bleeding.  She had been started on a heparin drip, but labs were obtained prior to doing so. -Recheck H&H and continue to monitor daily  Hypokalemia- (present on admission) Acute.  On admission potassium noted to be 3.1.  Patient has been given 40 mEq of potassium chloride p.o. likely secondary to patient recently been started on furosemide 20 mg twice daily on 2/1. -Give additional potassium chloride 50 meq p.o. -Continue to monitor and replace as needed  Hypertension- (present on admission) Blood pressures appear to be currently maintained 92/62-126/68.  Home blood pressure regimen includes amlodipine 5 mg daily, furosemide 20 mg twice daily, metoprolol 100 mg daily. -Held current home regimen and will defer to cardiology  Chronic kidney disease (CKD), stage IIIa- (present on admission) On admission creatinine 1.18 with BUN 32.  Baseline creatinine previously noted to be around 0.9-1.  This is not greater than a 0.3 increased from baseline to suggest acute kidney injury, but this may signify mild renal insufficiency due to atrial fibrillation. -Avoid nephrotoxic agents at this time due to the possible need of cath -Monitor kidney function daily  Chronic diastolic CHF (congestive heart failure) (HCC) Last EF noted to be 60 to 65% with grade 3 diastolic function severe mitral regurgitation, moderate to severe tricuspid regurgitation, and pulmonary hypertension with. -Strict I&Os -Daily weights  Diabetes mellitus without complication ([place]) Home medication regimen includes metformin.  Last available hemoglobin A1c from 4 years ago was 6.4. -Hold metformin -Add on hemoglobin A1c  Hyperlipidemia- (present on admission) -Continue simvastatin    Advance Care Planning:    Code Status: Full Code   Consults: Cardiology  Family Communication: Daughter updated over the phone  Severity of Illness: The appropriate patient status for this patient is INPATIENT. Inpatient status is judged to be reasonable and necessary in order to provide the required intensity of service to ensure the patient's safety. The patient's presenting symptoms, physical exam findings, and initial radiographic and laboratory data in the context of their chronic comorbidities is felt to place them at high risk for further clinical deterioration. Furthermore, it is not anticipated that the patient will be medically stable for discharge from the hospital within 2 midnights of admission.   * I certify that at the point of admission it is my clinical judgment that the patient will require inpatient hospital care spanning beyond 2 midnights from the point of admission due to high intensity of service, high risk for further deterioration and high frequency of surveillance required.*  Author: Norval Morton, MD 05/31/2021 7:05 AM  For on call review [email or web address].

## 2021-05-31 NOTE — ED Notes (Signed)
Interpreter at bedside.

## 2021-05-31 NOTE — Progress Notes (Signed)
Contacted communication services for the deaf and hard of hearing, received return call from [place] and she is going to arrange interpreter to come to the floor.

## 2021-05-31 NOTE — Progress Notes (Signed)
ANTICOAGULATION CONSULT NOTE  Pharmacy Consult for heparin Indication: atrial fibrillation  Allergies  Allergen Reactions   Altace [Ramipril] Cough   Hyzaar [Losartan Potassium-Hctz] Cough   Hydrocodone Cough         Patient Measurements: Height: 5\' 2"  (157.5 cm) Weight: 57.4 kg (126 lb 9.6 oz) IBW/kg (Calculated) : 50.1  Vital Signs: Temp: 98 F (36.7 C) (02/22 2030) Temp Source: Oral (02/22 2030) BP: 122/57 (02/22 2030) Pulse Rate: 77 (02/22 2030)  Labs: Recent Labs    05/29/21 0754 05/31/21 0226 05/31/21 0400 05/31/21 0844 05/31/21 1100 05/31/21 2021  HGB 12.6 10.9*  --  12.5  --   --   HCT 38.3 34.6*  --  39.6  --   --   PLT 189 161  --   --   --   --   APTT  --  26  --   --   --   --   LABPROT  --  13.3  --   --   --   --   INR  --  1.0  --   --   --   --   HEPARINUNFRC  --   --   --   --  0.67 0.67  CREATININE 1.09* 1.18*  --   --   --   --   TROPONINIHS  --  29* 39*  --   --   --      Estimated Creatinine Clearance: 27.6 mL/min (A) (by C-G formula based on SCr of 1.18 mg/dL (H)).   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 ago   Pulmonary HTN ([facility])    PASP in the 50's by echo 04/2021   Pulmonic regurgitation    moderate by echo 04/2021   Tricuspid regurgitation    moderate to severe by echo 04/2021    Assessment: 88yo female c/o dizziness and palpitations, EMS found pt to be in Afib w/ RVR with rate to 160s >> to begin heparin. No AC PTA.  Heparin level came back therapeutic  at higher end of goal range (0.67), on 900 units/hr after bolus. Hgb 10.9, plt 161. No s/sx of bleeding or infusion issues.   HL came back therapeutic at 0.67. We will continue with the same rate.   Goal of Therapy:  Heparin level 0.3-0.7 units/ml Monitor platelets by anticoagulation protocol: Yes   Plan:  Continue heparin infusion at 900 units/hr  Monitor heparin levels and CBC.  Onnie Boer, PharmD, BCIDP, AAHIVP, CPP Infectious Disease Pharmacist 05/31/2021 8:55 PM

## 2021-05-31 NOTE — Assessment & Plan Note (Addendum)
-  Acute.   -Patient presented in atrial fibrillation with heart rates into the 160s.   -She had received Cardizem 10 mg IV in route and started on a Cardizem drip while in the ED which was then changed to IV amiodarone per cardiology.  -CHA2DS2-VASc score equal to at least 5 based off age, sex, HTN, and DM type II.   -She had been started on a Cardizem and heparin drip.  Cardiology have been formally consulted. -Admit to a progressive bed -Continue heparin per pharmacy and she will be changed to apixaban likely -Given her softer blood pressures Cardizem drip was transitioned to IV amiodarone -Patient spontaneously converted to normal sinus rhythm on IV amiodarone saw her TEE/DCCV will be postponed and she will no longer need a DCCV.  Patient will get a TEE as an outpatient for MitraClip purposes -Check TSH and was 1.540 -Goal to maintain at least potassium 4 and magnesium 2.  Will replace as needed. -[place] cardiology consultative services, will follow-up for further recommendations -Patient underwent a right and left heart catheterization and findings as delineated -Cardiology recommending p.o. amiodarone taper and continuing apixaban.  Plan is for TEE on Tuesday for MitraClip purposes

## 2021-05-31 NOTE — Progress Notes (Signed)
Pending labs included returned with magnesium level 1.8, hemoglobin 12.5, and TSH within normal limits at 1.54.  Patient was ordered magnesium sulfate 2 g IV.

## 2021-05-31 NOTE — Consult Note (Addendum)
Cardiology Consultation:   Patient ID: JNYA BROSSARD MRN: [id number]; DOB: May 12, 1933  Admit date: 05/31/2021 Date of Consult: 05/31/2021  Primary Care Provider: Kelton Pillar, MD [facility] Cardiologist: Fransico Him, MD  [facility] Electrophysiologist:  None    Patient Profile:   Maria Collier is a 88 y.o. 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 Joseph Berkshire.  History of Present Illness:   Maria Collier is a 88 year old female with the above history who is followed by Dr. Radford Pax and Dr. Ali Lowe. Patient was referred to Dr. Radford Pax in 12/2020 after not being seen by our group since 2016 for further evaluation of atypical chest pain and PVCs.  Chest pain sounded musculoskeletal in nature, likely from prior lumpectomy and radiation, but Myoview was ordered for further evaluation.  However, it looks like patient never had this done.  Echo was also ordered for further evaluation of a murmur.  Echo in 04/2021 showed LVEF of 60-65% with normal wall motion, mild LVH, grade 3 diastolic dysfunction as well as severe MR, moderate to severe TR, and moderately elevated PASP of 56.3 mmHg.  She was referred to the [facility] for further evaluation of valvular disease.  She was recently seen by Dr. Ali Lowe on 05/10/2021 and outpatient TEE and right/left cardiac catheterization was recommended as part of work-up for potential MitraClip.  Procedures were scheduled for next week on 06/06/2021.  Patient presented to the ED earlier this morning via EMS for further evaluation of dizziness, palpitations, and presyncope.  Upon EMS arrival, she was noted to be in atrial fibrillation with rates ranging from the 140s to 160s.  Upon arrival to the ED,  BP soft but stable and patient tachycardic.  EKG showed atrial fibrillation, rate 137, with VH, mild diffuse ST depression (likely rate related), and T wave inversions in lead aVL.  High-sensitivity troponin minimally elevated and flat at 29 >> 39 consistent with demand ischemia. BNP elevated at 597. Chest x-ray showed cardiomegaly but no acute findings. WBC 8.0, Hgb 10.9, Plts 161. Na 139, K 3.1, Glucose 177, BUN 32, Cr 1.18.  She was started on IV Cardizem and IV Heparin and admitted.  Cardiology consulted for further evaluation.  At the time of this evaluation, patient resting comfortably in no acute distress.  In person interpreter was used to help obtain history and assist with visit.  Patient was in her usual state of health until last night. She states she was going to get a bath when she started feeling "funny."  She began to feel dizzy and felt like she may pass out so she went back down and sat on the bed.  She also reports being diaphoretic and developing mild chest tightness as well as some shortness of breath.  She as chronic intermittent shortness of breath both at rest and with exertion.  She describes having difficulty with activities such as walking to the mailbox over the last year.  She also describes possible orthopnea.  No PND.  She has occasional left lower extremity swelling but this is not new.  No other significant edema.  She denies any palpitations with her atrial fibrillation.  No syncope.  She has a chronic intermittent cough and describes some chest congestion postnasal drip last week.  However, no recent fevers or other illnesses.  She had some nausea with the above symptoms last night but no other GI symptoms.  No abnormal bleeding in urine or stools.  She remains in rapid atrial fibrillation at this time and continues to have some mild dizziness and chest tightness.  BP soft at times but overall stable.  Past Medical History:  Diagnosis Date   Deafness 1939   following  meningitis   Diabetes mellitus without complication ([place])    Dyspnea    at night if the house is hot   Family history of breast cancer    Family history of colonic polyps    Family history of ovarian cancer    GERD (gastroesophageal reflux disease)    occasionally   Headache    sometimes   Hyperlipidemia    Hypertension    Meningitis    Mitral regurgitation    severe by echo 04/2021   Osteoporosis    Pneumonia    yrs. ago   Pulmonary HTN ([place])    PASP in the 50's by echo 04/2021   Pulmonic regurgitation    moderate by echo 04/2021   Tricuspid regurgitation    moderate to severe by echo 04/2021    Past Surgical History:  Procedure Laterality Date   ABDOMINAL HYSTERECTOMY     BREAST LUMPECTOMY WITH RADIOACTIVE SEED LOCALIZATION Left 05/15/2017   Procedure: BREAST LUMPECTOMY WITH RADIOACTIVE SEED LOCALIZATION;  Surgeon: Fanny Skates, MD;  Location: [place];  Service: General;  Laterality: Left;     Home Medications:  Prior to Admission medications   Medication Sig Start Date End Date Taking? Authorizing Provider  acetaminophen (TYLENOL) 500 MG tablet Take 500 mg by mouth daily as needed for mild pain or headache.    [provider]  amLODipine (NORVASC) 5 MG tablet Take 5 mg by mouth daily. 09/22/20   [provider]  aspirin 81 MG tablet Take 81 mg by mouth at bedtime.     [provider]  Cholecalciferol (VITAMIN D) 50 MCG (2000 UT) CAPS Take 2,000 Units by mouth daily.    [provider]  Cyanocobalamin (VITAMIN B12 TR) 2000 MCG TBCR 1 tablet    [provider]  erythromycin ophthalmic ointment APPLY TO RIGHT LOWER LID TWICE A DAY FOR 2 WEEKS 06/10/17   [provider]  folic acid (FOLVITE) 185 MCG tablet Take 800 mcg by mouth daily.    [provider]  furosemide (LASIX) 20 MG tablet Take 1 tablet (20 mg total) by mouth daily. 05/30/21   Early Osmond, MD  hyaluronate sodium (RADIAPLEXRX) GEL Apply 1 application  topically 2 (two) times daily.    [provider]  hydrochlorothiazide (HYDRODIURIL) 25 MG tablet Take 25 mg by mouth daily.    [provider]  metFORMIN (GLUCOPHAGE-XR) 500 MG 24 hr tablet Take 500 mg by mouth every evening.    [provider]  metoprolol succinate (TOPROL-XL) 100 MG 24 hr tablet Take 100 mg by mouth daily. Take with or immediately following a meal.    [provider]  Misc Natural Products (ELDERBERRY ZINC/VIT C/IMMUNE MT) 1 gummy    [provider]  Omega-3 Fatty Acids (FISH OIL PO) 1 gummy    [provider]  Polyethyl Glycol-Propyl Glycol (SYSTANE OP) Apply 1 drop to eye 4 (four) times daily as needed (dry eyes).    [provider]  simvastatin (ZOCOR) 40 MG tablet Take 40 mg by mouth every evening.     [provider]  vitamin C (ASCORBIC ACID) 500 MG tablet Take 500 mg by mouth daily.    [provider]  vitamin E 1000 UNIT capsule Take 1,000 Units by mouth daily.    [provider]    Inpatient Medications: Scheduled Meds:   Continuous Infusions:  diltiazem (CARDIZEM) infusion 10 mg/hr (05/31/21 0444)   heparin 900 Units/hr (05/31/21 0401)   PRN Meds:   Allergies:    Allergies  Allergen Reactions   Altace [Ramipril] Cough    COUGH   Hyzaar [Losartan Potassium-Hctz] Cough    COUGH   Hydrocodone Cough    COUGH     Social History:   Social History   Socioeconomic History   Marital status: Married    Spouse name: Not on file   Number of children: Not on file   Years of education: Not on file   Highest education level: Not on file  Occupational History   Not on file  Tobacco Use   Smoking status: Never   Smokeless tobacco: Never  Vaping Use   Vaping Use: Never used  Substance and Sexual Activity   Alcohol use: No   Drug use: No   Sexual activity: Not on file  Other Topics Concern   Not on file  Social History Narrative   Not on file   Social  Determinants of Health   Financial Resource Strain: Not on file  Food Insecurity: Not on file  Transportation Needs: No Transportation Needs   Lack of Transportation (Medical): No   Lack of Transportation (Non-Medical): No  Physical Activity: Not on file  Stress: Not on file  Social Connections: Not on file  Intimate Partner Violence: Not on file    Family History:   Family History  Problem Relation Age of Onset   Ovarian cancer Daughter        59s   Breast cancer Daughter 28   Heart disease Father    Cancer Paternal Grandmother        ? stomach cancer   Cancer Paternal Grandfather    Other Sister        d. 56s, had stomach issues, e.g. ulcers   Hypertension Brother    Colon polyps Daughter        6 polyps on first colonoscopy     ROS:  Please see HPI. Review of Systems  Constitutional:  Negative for chills and fever.  HENT:  Positive for congestion (post nasal drip) and hearing loss (deafness from meningitis).   Respiratory:  Positive for cough and shortness of breath. Negative for hemoptysis.   Cardiovascular:  Positive for chest pain, orthopnea and leg swelling. Negative for palpitations and PND.  Gastrointestinal:  Positive for nausea. Negative for blood in stool, melena and vomiting.  Genitourinary:  Negative for hematuria.  Musculoskeletal:  Negative for myalgias.  Neurological:  Positive for dizziness. Negative for loss of consciousness.  Endo/Heme/Allergies:  Does not bruise/bleed easily.  Psychiatric/Behavioral:  Negative for substance abuse.     Physical Exam/Data:   Vitals:   05/31/21 0415 05/31/21 0500 05/31/21 0545 05/31/21 0700  BP: 107/66 112/70 100/77 (!) 112/100  Pulse: (!) 133 (!) 134 (!) 115 (!) 139  Resp: (!) 21 (!) 27 19 16   Temp:      TempSrc:      SpO2: 98% 97% 100% 99%  Weight:      Height:       No intake or output data in the 24 hours ending 05/31/21 0724 Last 3 Weights 05/31/2021  05/10/2021 12/26/2020  Weight (lbs) 126 lb 9.6 oz 126 lb  9.6 oz 129 lb 6.4 oz  Weight (kg) 57.425 kg 57.425 kg 58.695 kg     Body mass index is 23.16 kg/m.  General: 88 y.o. thin Caucasian female resting comfortably in no acute distress.  HEENT: Normocephalic and atraumatic. Sclera clear. Neck: Supple. No significant JVD. Heart: Tachycardic with irregularly irregular rhythm. II/VI systolic murmur. No gallops or rubs. Radial pulses 2+ and equal bilaterally. Lungs: No increased work of breathing. Clear to ausculation bilaterally. No wheezes, rhonchi, or rales.  Abdomen: Soft, non-distended, and non-tender to palpation. Bowel sounds present.  Extremities: No lower extremity edema.    Skin: Warm and dry. Neuro: Alert and oriented x3. No focal deficits. Psych: Normal affect. Responds appropriately.  EKG:  The EKG was personally reviewed and demonstrates: Atrial fibrillation, rate 137 bpm, with RBBB, LAFB, LVH, mild diffuse ST depressions (likely rate related), and T wave inversions in aVL. Left axis deviation. QTc 493 ms.  Telemetry:  Telemetry was personally reviewed and demonstrates:  Atrial fibrillation with rates in the 110s to 150s.  Relevant CV Studies:  Echocardiogram 04/24/2021: Impressions:  1. Left ventricular ejection fraction, by estimation, is 60 to 65%. The  left ventricle has normal function. The left ventricle has no regional  wall motion abnormalities. There is mild left ventricular hypertrophy.  Left ventricular diastolic parameters  are consistent with Grade III diastolic dysfunction (restrictive).  Elevated left atrial pressure.   2. Right ventricular systolic function is normal. The right ventricular  size is mildly enlarged. There is moderately elevated pulmonary artery  systolic pressure. The estimated right ventricular systolic pressure is  38.2 mmHg.   3. Left atrial size was severely dilated.   4. Right atrial size was mildly dilated.   5. The mitral valve is degenerative. Severe mitral valve regurgitation.  Severe  mitral annular calcification.   6. Pulmonic valve regurgitation is moderate.   7. The tricuspid valve is abnormal. Tricuspid valve regurgitation is  moderate to severe.   8. The aortic valve is tricuspid. Aortic valve regurgitation is trivial.  Aortic valve sclerosis is present, with no evidence of aortic valve  stenosis.   9. The inferior vena cava is normal in size with greater than 50%  respiratory variability, suggesting right atrial pressure of 3 mmHg.   Laboratory Data:  High Sensitivity Troponin:   Recent Labs  Lab 05/31/21 0226 05/31/21 0400  TROPONINIHS 29* 39*     Chemistry Recent Labs  Lab 05/29/21 0754 05/31/21 0226  NA 143 139  K 3.5 3.1*  CL 94* 95*  CO2 32* 32  GLUCOSE 154* 177*  BUN 31* 32*  CREATININE 1.09* 1.18*  CALCIUM 9.9 10.2  GFRNONAA  --  45*  ANIONGAP  --  12    No results for input(s): PROT, ALBUMIN, AST, ALT, ALKPHOS, BILITOT in the last 168 hours. Hematology Recent Labs  Lab 05/29/21 0754 05/31/21 0226  WBC 7.3 8.0  RBC 4.02 3.51*  HGB 12.6 10.9*  HCT 38.3 34.6*  MCV 95 98.6  MCH 31.3 31.1  MCHC 32.9 31.5  RDW 11.9 12.8  PLT 189 161   BNP Recent Labs  Lab 05/31/21 0226  BNP 597.6*    DDimer No results for input(s): DDIMER in the last 168 hours.  Radiology/Studies:  [facility] Chest Port 1 View  Result Date: 05/31/2021 CLINICAL DATA:  Chest pain. EXAM: PORTABLE CHEST 1 VIEW COMPARISON:  May 02, 2010 FINDINGS: There is moderate to  marked severity enlargement of the cardiac silhouette which is increased in severity when compared to the prior study. Marked severity curvilinear cardiac related calcification is seen overlying the medial aspect of the left lung base. Mildly decreased lung volumes are noted. Both lungs are clear. Multilevel degenerative changes are seen throughout the thoracic spine. IMPRESSION: Cardiomegaly, as described above, without acute cardiopulmonary disease. Electronically Signed   By: Virgina Norfolk M.D.   On:  05/31/2021 02:54     Assessment and Plan:   New Onset Atrial Fibrillation with RVR Patient presented with new palpitations, dizziness, and presycnope likely secondary to new onset atrial fibrillation with RVR which is likely the result of her severe MR and severe left atrium dilation.  Recent Echo showed normal LV function.  She was started on IV Cardizem but remains in rapid atrial fibrillation. BP soft at times. - Potassium 3.1.  Supplemented. - Will check magnesium and TSH. - Will stop IV Cardizem give soft pressure and start IV Amiodarone with bolus. - Continue home Toprol-XL 100mg  daily. - CHA2DS2-VASc = 4 (HTN, DM, age x2, female). Continue IV Heparin for now in anticipation for right/left heart catheterization tomorrow. After cath, plan will be to switch her to Eliquis. Plan is then for TEE/DCCV on Friday 06/02/2021. 746} The risks [stroke, cardiac arrhythmias rarely resulting in the need for a temporary or permanent pacemaker, skin irritation or burns, esophageal damage, perforation (1:10,000 risk), bleeding, pharyngeal hematoma as well as other potential complications associated with conscious sedation including aspiration, arrhythmia, respiratory failure and death], benefits (treatment guidance, restoration of normal sinus rhythm, diagnostic support) and alternatives of a transesophageal echocardiogram guided cardioversion were discussed in detail with Maria Collier and she is willing to proceed.  Severe Mitral Regurgitation Severe Tricuspid Regurgitation Mitral Annular Calcifications Recent Echo last month showed severe mitral annular calcification with severe MR and moderate to severe TR. She was seen by Dr. Ali Lowe who was planning for TEE and [facility] next week to determine if this is amennable to transcather mitral edge-to-edge repair vs surgery. - Patient has been taking Lasix twice a day rather than once daily and appears on the dry side so will hold Lasix for now. - Discussed with Dr.  Ali Lowe, will proceed with [facility] tomorrow. Patient will then undergo TEE/DCCV for atrial fibrillation. Will be unable to do MitraClip TEE at the same time given we do not have a Structural Heart Reader available.  The patient understands that risks include but are not limited to stroke (1 in 1000), death (1 in 65), kidney failure [usually temporary] (1 in 500), bleeding (1 in 200), allergic reaction [possibly serious] (1 in 200), and agrees to proceed.   Chest Tightness Elevated Troponin Patient reports mild chest tightness which I suspect is due to rapid atrial fibrillation. EKG shows mild diffuse ST depressions which is likely rate related. High-sensitivity troponin minimally elevated and flat at 29 >> 39. - Not consistent with ACS. Consistent with demand ischemia from rapid atrial fibrillation. However, plan is for [facility] for work-up of valvular disease.  Hypertension BP soft at times but stable. - Will stop IV Diltiazem as above. - Continue Toprol-XL 100mg  daily. - Will hold home Amlodipine and HCTZ to allow for more rate control if needed.  Hyperlipidemia - Continue home Simvastatin 40mg  daily.  For questions or updates, please contact [place] Please consult [email or web address] for contact info under     Signed, Darreld Mclean, PA-C  05/31/2021 7:24 AM  ATTENDING ATTESTATION:  After conducting a  review of all available clinical information with the care team, interviewing the patient, and performing a physical exam, I agree with the findings and plan described in this note.   GEN: No acute distress.   Cardiac: Irregular and tachycardic, 3/6 holosystolic murmur, rubs, or gallops.  Respiratory: Clear to auscultation bilaterally. GI: Soft, nontender, non-distended  MS: No edema; No deformity. Neuro:  Nonfocal  Vasc:  +2 radial pulses  Patient known to me from outpatient evaluation for severe symptomatic mitral regurgitation.  Patient now with rapid atrial fibrillation.   We will start amiodarone load and continuous infusion.  N.p.o. at midnight for right heart catheterization and coronary angiography study tomorrow to complete MitraClip evaluation.  Unable to obtain TEE cardioversion tomorrow due to scheduling issues so we will plan TEE cardioversion on Friday unless the patient converts spontaneously.  Continue heparin drip for now.  Will need anticoagulation at discharge.  Lenna Sciara, MD Pager [phone]

## 2021-06-01 ENCOUNTER — Inpatient Hospital Stay (HOSPITAL_COMMUNITY)
Admission: EM | Disposition: A | Discharge status: Home / Self Care | Payer: Self-pay | Source: Home / Self Care | Attending: Internal Medicine

## 2021-06-01 DIAGNOSIS — E119 Type 2 diabetes mellitus without complications: Secondary | ICD-10-CM | POA: Diagnosis not present

## 2021-06-01 DIAGNOSIS — N1831 Chronic kidney disease, stage 3a: Secondary | ICD-10-CM | POA: Diagnosis not present

## 2021-06-01 DIAGNOSIS — I4891 Unspecified atrial fibrillation: Secondary | ICD-10-CM | POA: Diagnosis not present

## 2021-06-01 DIAGNOSIS — I5032 Chronic diastolic (congestive) heart failure: Secondary | ICD-10-CM | POA: Diagnosis not present

## 2021-06-01 DIAGNOSIS — I340 Nonrheumatic mitral (valve) insufficiency: Secondary | ICD-10-CM | POA: Diagnosis not present

## 2021-06-01 HISTORY — PX: RIGHT/LEFT HEART CATH AND CORONARY ANGIOGRAPHY: CATH118266

## 2021-06-01 LAB — BASIC METABOLIC PANEL
Anion gap: 6 (ref 5–15)
BUN: 32 mg/dL — ABNORMAL HIGH (ref 8–23)
CO2: 32 mmol/L (ref 22–32)
Calcium: 9.3 mg/dL (ref 8.9–10.3)
Chloride: 97 mmol/L — ABNORMAL LOW (ref 98–111)
Creatinine, Ser: 1.35 mg/dL — ABNORMAL HIGH (ref 0.44–1.00)
GFR, Estimated: 39 mL/min — ABNORMAL LOW (ref >60)
Glucose, Bld: 141 mg/dL — ABNORMAL HIGH (ref 70–99)
Potassium: 4.5 mmol/L (ref 3.5–5.1)
Sodium: 135 mmol/L (ref 135–145)

## 2021-06-01 LAB — POCT I-STAT EG7
Acid-Base Excess: 1 mmol/L (ref 0.0–2.0)
Acid-Base Excess: 5 mmol/L — ABNORMAL HIGH (ref 0.0–2.0)
Bicarbonate: 27.9 mmol/L (ref 20.0–28.0)
Bicarbonate: 32.2 mmol/L — ABNORMAL HIGH (ref 20.0–28.0)
Calcium, Ion: 1.02 mmol/L — ABNORMAL LOW (ref 1.15–1.40)
Calcium, Ion: 1.25 mmol/L (ref 1.15–1.40)
HCT: 27 % — ABNORMAL LOW (ref 36.0–46.0)
HCT: 30 % — ABNORMAL LOW (ref 36.0–46.0)
Hemoglobin: 10.2 g/dL — ABNORMAL LOW (ref 12.0–15.0)
Hemoglobin: 9.2 g/dL — ABNORMAL LOW (ref 12.0–15.0)
O2 Saturation: 67 %
O2 Saturation: 69 %
Potassium: 3.6 mmol/L (ref 3.5–5.1)
Potassium: 4.2 mmol/L (ref 3.5–5.1)
Sodium: 139 mmol/L (ref 135–145)
Sodium: 144 mmol/L (ref 135–145)
TCO2: 29 mmol/L (ref 22–32)
TCO2: 34 mmol/L — ABNORMAL HIGH (ref 22–32)
pCO2, Ven: 52.7 mmHg (ref 44–60)
pCO2, Ven: 59.2 mmHg (ref 44–60)
pH, Ven: 7.332 (ref 7.25–7.43)
pH, Ven: 7.343 (ref 7.25–7.43)
pO2, Ven: 38 mmHg (ref 32–45)
pO2, Ven: 39 mmHg (ref 32–45)

## 2021-06-01 LAB — POCT I-STAT 7, (LYTES, BLD GAS, ICA,H+H)
Acid-Base Excess: 4 mmol/L — ABNORMAL HIGH (ref 0.0–2.0)
Acid-Base Excess: 4 mmol/L — ABNORMAL HIGH (ref 0.0–2.0)
Bicarbonate: 30.4 mmol/L — ABNORMAL HIGH (ref 20.0–28.0)
Bicarbonate: 30.6 mmol/L — ABNORMAL HIGH (ref 20.0–28.0)
Calcium, Ion: 1.24 mmol/L (ref 1.15–1.40)
Calcium, Ion: 1.27 mmol/L (ref 1.15–1.40)
HCT: 30 % — ABNORMAL LOW (ref 36.0–46.0)
HCT: 30 % — ABNORMAL LOW (ref 36.0–46.0)
Hemoglobin: 10.2 g/dL — ABNORMAL LOW (ref 12.0–15.0)
Hemoglobin: 10.2 g/dL — ABNORMAL LOW (ref 12.0–15.0)
O2 Saturation: 100 %
O2 Saturation: 99 %
Potassium: 4.1 mmol/L (ref 3.5–5.1)
Potassium: 4.2 mmol/L (ref 3.5–5.1)
Sodium: 133 mmol/L — ABNORMAL LOW (ref 135–145)
Sodium: 134 mmol/L — ABNORMAL LOW (ref 135–145)
TCO2: 32 mmol/L (ref 22–32)
TCO2: 32 mmol/L (ref 22–32)
pCO2 arterial: 56.2 mmHg — ABNORMAL HIGH (ref 32–48)
pCO2 arterial: 56.7 mmHg — ABNORMAL HIGH (ref 32–48)
pH, Arterial: 7.339 — ABNORMAL LOW (ref 7.35–7.45)
pH, Arterial: 7.341 — ABNORMAL LOW (ref 7.35–7.45)
pO2, Arterial: 179 mmHg — ABNORMAL HIGH (ref 83–108)
pO2, Arterial: 190 mmHg — ABNORMAL HIGH (ref 83–108)

## 2021-06-01 LAB — CBC
HCT: 31.9 % — ABNORMAL LOW (ref 36.0–46.0)
Hemoglobin: 10.4 g/dL — ABNORMAL LOW (ref 12.0–15.0)
MCH: 31.9 pg (ref 26.0–34.0)
MCHC: 32.6 g/dL (ref 30.0–36.0)
MCV: 97.9 fL (ref 80.0–100.0)
Platelets: 146 10*3/uL — ABNORMAL LOW (ref 150–400)
RBC: 3.26 MIL/uL — ABNORMAL LOW (ref 3.87–5.11)
RDW: 13 % (ref 11.5–15.5)
WBC: 6.9 10*3/uL (ref 4.0–10.5)
nRBC: 0 % (ref 0.0–0.2)

## 2021-06-01 LAB — MAGNESIUM: Magnesium: 2.1 mg/dL (ref 1.7–2.4)

## 2021-06-01 LAB — HEPARIN LEVEL (UNFRACTIONATED): Heparin Unfractionated: 0.5 IU/mL (ref 0.30–0.70)

## 2021-06-01 SURGERY — RIGHT/LEFT HEART CATH AND CORONARY ANGIOGRAPHY
Anesthesia: LOCAL

## 2021-06-01 MED ORDER — SODIUM CHLORIDE 0.9% FLUSH: 3.0000 mL | INTRAVENOUS | Status: DC | PRN

## 2021-06-01 MED ORDER — LIDOCAINE HCL (PF) 1 % IJ SOLN
INTRAMUSCULAR | Status: DC | PRN
Start: 2021-06-01 — End: 2021-06-01
  Administered 2021-06-01 (×2): 2 mL via INTRADERMAL

## 2021-06-01 MED ORDER — LIDOCAINE HCL (PF) 1 % IJ SOLN
INTRAMUSCULAR | Status: AC
  Filled 2021-06-01: qty 30

## 2021-06-01 MED ORDER — VERAPAMIL HCL 2.5 MG/ML IV SOLN
INTRAVENOUS | Status: AC
  Filled 2021-06-01: qty 2

## 2021-06-01 MED ORDER — MIDAZOLAM HCL 2 MG/2ML IJ SOLN
INTRAMUSCULAR | Status: AC
  Filled 2021-06-01: qty 2

## 2021-06-01 MED ORDER — HEPARIN (PORCINE) IN NACL 1000-0.9 UT/500ML-% IV SOLN
INTRAVENOUS | Status: AC
  Filled 2021-06-01: qty 500

## 2021-06-01 MED ORDER — MIDAZOLAM HCL 2 MG/2ML IJ SOLN
INTRAMUSCULAR | Status: DC | PRN
Start: 2021-06-01 — End: 2021-06-01
  Administered 2021-06-01: 1 mg via INTRAVENOUS

## 2021-06-01 MED ORDER — HEPARIN SODIUM (PORCINE) 1000 UNIT/ML IJ SOLN
INTRAMUSCULAR | Status: AC
  Filled 2021-06-01: qty 10

## 2021-06-01 MED ORDER — SODIUM CHLORIDE 0.9% FLUSH
3.0000 mL | Freq: Two times a day (BID) | INTRAVENOUS | Status: DC
  Administered 2021-06-02 (×2): 3 mL via INTRAVENOUS

## 2021-06-01 MED ORDER — HEPARIN (PORCINE) IN NACL 2-0.9 UNITS/ML
INTRAMUSCULAR | Status: DC | PRN
  Administered 2021-06-01: 10 mL via INTRA_ARTERIAL

## 2021-06-01 MED ORDER — IOHEXOL 350 MG/ML SOLN
INTRAVENOUS | Status: DC | PRN
  Administered 2021-06-01: 30 mL via INTRA_ARTERIAL

## 2021-06-01 MED ORDER — SODIUM CHLORIDE 0.9 % IV SOLN: INTRAVENOUS | Status: DC

## 2021-06-01 MED ORDER — FENTANYL CITRATE (PF) 100 MCG/2ML IJ SOLN
INTRAMUSCULAR | Status: AC
  Filled 2021-06-01: qty 2

## 2021-06-01 MED ORDER — HEPARIN (PORCINE) IN NACL 1000-0.9 UT/500ML-% IV SOLN
INTRAVENOUS | Status: DC | PRN
  Administered 2021-06-01 (×2): 500 mL

## 2021-06-01 MED ORDER — APIXABAN 2.5 MG PO TABS
2.5000 mg | ORAL_TABLET | Freq: Two times a day (BID) | ORAL | Status: DC
  Administered 2021-06-01 – 2021-06-02 (×2): 2.5 mg via ORAL
  Filled 2021-06-01 (×2): qty 1

## 2021-06-01 MED ORDER — SODIUM CHLORIDE 0.9 % IV SOLN: 250.0000 mL | INTRAVENOUS | Status: DC | PRN

## 2021-06-01 MED ORDER — SODIUM CHLORIDE 0.9 % WEIGHT BASED INFUSION
1.0000 mL/kg/h | INTRAVENOUS | Status: AC
  Administered 2021-06-01: 1 mL/kg/h via INTRAVENOUS

## 2021-06-01 MED ORDER — AMIODARONE HCL 200 MG PO TABS
400.0000 mg | ORAL_TABLET | Freq: Two times a day (BID) | ORAL | Status: DC
  Administered 2021-06-01 – 2021-06-02 (×3): 400 mg via ORAL
  Filled 2021-06-01 (×3): qty 2

## 2021-06-01 MED ORDER — FENTANYL CITRATE (PF) 100 MCG/2ML IJ SOLN
INTRAMUSCULAR | Status: DC | PRN
  Administered 2021-06-01: 25 ug via INTRAVENOUS

## 2021-06-01 SURGICAL SUPPLY — 11 items
CATH 5FR JL3.5 JR4 ANG PIG MP (CATHETERS) ×1 IMPLANT
CATH BALLN WEDGE 5F 110CM (CATHETERS) ×1 IMPLANT
DEVICE RAD TR BAND REGULAR (VASCULAR PRODUCTS) ×1 IMPLANT
GLIDESHEATH SLEND SS 6F .021 (SHEATH) ×1 IMPLANT
GUIDEWIRE INQWIRE 1.5J.035X260 (WIRE) IMPLANT
INQWIRE 1.5J .035X260CM (WIRE) ×2
KIT HEART LEFT (KITS) ×3 IMPLANT
PACK CARDIAC CATHETERIZATION (CUSTOM PROCEDURE TRAY) ×2 IMPLANT
SHEATH GLIDE SLENDER 4/5FR (SHEATH) ×1 IMPLANT
TRANSDUCER W/STOPCOCK (MISCELLANEOUS) ×3 IMPLANT
TUBING CIL FLEX 10 FLL-RA (TUBING) ×2 IMPLANT

## 2021-06-01 NOTE — Hospital Course (Addendum)
The patient is an 88 year old deaf Caucasian female with past medical history significant for but limited to hypertension, hyperlipidemia, chronic diastolic CHF, history of severe mitral regurgitation, tricuspid regurgitation, pulmonary hypertension, diabetes mellitus type 2, GERD as well as other comorbidities who presented with a chief complaint of dizziness and chest pain.  History is obtained using some language services yesterday per the admitting physician and she is about to take a bath around 11:30 PM when she reported feeling funny.  She complained of having a squeezing centralized chest pain that broke out into a sweat and she also fell due to dizziness but was able to catch herself and sit down.  She placed a cool rag over her head and called 911.  She associated symptoms of shortness of breath, cough, nausea and orthopnea.  At the beginning of this year she notes that she has been short of breath with activity and rest and she has to rest a couple times when walking any distance due to feeling out of breath.  She states the cough has been present intermittently from time to time but is not necessarily new and she has had chronic leg swelling and has not noticed any significant change.  She denies any fevers, loss of consciousness abdominal pain, palpitations and reports that she has intermittent chest pain and discomfort before but the symptoms were never this severe.  She was followed by cardiology outpatient setting and was in the process of being worked up for her valve Buehler issues and was last seen in the cardiologist office on 05/10/2021 with plans for her to have a TEE without left and right heart cath for further evaluation.  At that time she is also started on furosemide 20 mg p.o. twice which she was advised to take until last night when she called and she was instructed to only take once daily.  In the ED patient noted to be in A-fib with RVR with rates in the 140s to 160s and Cardizem was  given.  Upon arrival to the ED rates ranged from 100s to 130s and her respirations were elevated.  Labs are significant for hemoglobin of 10.9 and potassium 3.1.  Influenza and COVID testing were negative and the chest x-ray was done which noted increased cardiomegaly without any acute cardiopulmonary disease.  She was started on a heparin drip as well as diltiazem drip and given 40 mg of p.o. potassium chloride and tried was asked to admit this patient.  Cardiology was consulted for further evaluation with her new onset A-fib with RVR and they recommended stopping IV Cardizem and starting IV amiodarone with bolus.  They recommended continuing her on Toprol and recommended continuing heparin for now and anticipation for right left heart cath which is being done today.  After the cath the plan is to switch her to Eliquis and the plan is then for her to have a TEE/DCCV on Friday, 06/02/2021.  The plan was for her to undergo right heart catheterization and coronary angiography to complete MitraClip evaluation.  Patient was to be scheduled for a TEE/DCCV but she converted to normal sinus rhythm on amiodarone and then cardiology is transitioning her to p.o. amiodarone with taper and continue Toprol.  Because she transition to normal sinus rhythm cardiology is planning on discharging tomorrow on anticoagulation with outpatient TEE for MitraClip purposes.  Patient's right and left heart cath and coronary angiography was done and showed left ventricular end-diastolic pressure was normal and with normal coronary anatomy with normal left  ventricular filling pressures with PCP WP of 7 mm per mercury and a right heart pressures of a PAP mean of 17 mm per mercury and a normal cardiac output index of 3.03.  Patient was supposed to go for TEE today however the structural reader was not available for TEE MitraClip today so they are recommending outpatient TEE early next week on Tuesday.  She remained in normal sinus rhythm and  cardiology recommended transitioning her to p.o. amiodarone taper which has been done.  She is improved and will need to follow-up with PCP and cardiology in outpatient setting within 1 to 2 weeks.

## 2021-06-01 NOTE — Interval H&P Note (Signed)
History and Physical Interval Note:  06/01/2021 12:48 PM  Maria Collier  has presented today for surgery, with the diagnosis of severe mr.  The various methods of treatment have been discussed with the patient and family. After consideration of risks, benefits and other options for treatment, the patient has consented to  Procedure(s): RIGHT/LEFT HEART CATH AND CORONARY ANGIOGRAPHY (N/A) as a surgical intervention.  The patient's history has been reviewed, patient examined, no change in status, stable for surgery.  I have reviewed the patient's chart and labs.  Questions were answered to the patient's satisfaction.     Collier [facility] 06/01/2021 12:48 PM

## 2021-06-01 NOTE — Progress Notes (Addendum)
While rounding noticed patient's daughter sitting in room while mother was down for procedure. Stopped in to see how she was handling her mother's condition and progress. Stated that she was doing well. Daughter was very thankful for having A.D. completed. While there patient returned from procedure. Allowed staff to get patient settled and time for mother and daughter to connect. [place], [place]. [phone].

## 2021-06-01 NOTE — Progress Notes (Signed)
ANTICOAGULATION CONSULT NOTE  Pharmacy Consult for heparin Indication: atrial fibrillation  Allergies  Allergen Reactions   Altace [Ramipril] Cough   Hyzaar [Losartan Potassium-Hctz] Cough   Hydrocodone Cough         Patient Measurements: Height: 5\' 2"  (157.5 cm) Weight: 56.8 kg (125 lb 3.5 oz) IBW/kg (Calculated) : 50.1  Vital Signs: Temp: 98.5 F (36.9 C) (02/23 0549) Temp Source: Oral (02/23 0549) BP: 117/48 (02/23 0549) Pulse Rate: 67 (02/23 0549)  Labs: Recent Labs    05/29/21 0754 05/31/21 0226 05/31/21 0226 05/31/21 0400 05/31/21 0844 05/31/21 1100 05/31/21 2021 06/01/21 0346  HGB 12.6 10.9*   < >  --  12.5  --   --  10.4*  HCT 38.3 34.6*  --   --  39.6  --   --  31.9*  PLT 189 161  --   --   --   --   --  146*  APTT  --  26  --   --   --   --   --   --   LABPROT  --  13.3  --   --   --   --   --   --   INR  --  1.0  --   --   --   --   --   --   HEPARINUNFRC  --   --   --   --   --  0.67 0.67 0.50  CREATININE 1.09* 1.18*  --   --   --   --   --  1.35*  TROPONINIHS  --  29*  --  39*  --   --   --   --    < > = values in this interval not displayed.     Estimated Creatinine Clearance: 24.1 mL/min (A) (by C-G formula based on SCr of 1.35 mg/dL (H)).   Medical History: Past Medical History:  Diagnosis Date   Deafness 1937   following meningitis   Diabetes mellitus without complication ([place])    Dyspnea    at night if the house is hot   Family history of breast cancer    Family history of colonic polyps    Family history of ovarian cancer    GERD (gastroesophageal reflux disease)    occasionally   Headache    sometimes   Hyperlipidemia    Hypertension    Meningitis    Mitral regurgitation    severe by echo 04/2021   Osteoporosis    Pneumonia    yrs. ago   Pulmonary HTN (HCC)    PASP in the 50's by echo 04/2021   Pulmonic regurgitation    moderate by echo 04/2021   Tricuspid regurgitation    moderate to severe by echo 04/2021     Assessment: 88yo female c/o dizziness and palpitations, EMS found pt to be in Afib w/ RVR with rate to 160s >> to begin heparin. No AC PTA.  Heparin level came back therapeutic at 0.5, on 900 units/hr. Hgb 10.4, plt 146. No s/sx of bleeding or infusion issues.   Goal of Therapy:  Heparin level 0.3-0.7 units/ml Monitor platelets by anticoagulation protocol: Yes   Plan:  Continue heparin infusion at 900 units/hr  Monitor heparin levels and CBC.  Antonietta Jewel, PharmD, [place] Clinical Pharmacist  Phone: [phone] 06/01/2021 7:31 AM  Please check AMION for all [place] phone numbers After 10:00 PM, call [place] [phone]

## 2021-06-01 NOTE — Progress Notes (Signed)
°   06/01/21 0815  Clinical Encounter Type  Visited With Patient;Other (Comment) (Medical Sign Language Interpreter, [place] Mariana Arn; Notary Jaclynn Major)  Visit Type Follow-up;Other (Comment) (Advance Directive)  Referral From Nurse  Consult/Referral To [facility] (For Healthcare)  Does Patient Have a Medical Advance Directive? Yes  Mental Health Advance Directives  Does Patient Have a Mental Health Advance Directive? No   Requested to coordinate Notary and two volunteers from [employer] for the signing of patient's Advance Directive: [place] and Living Will to coincide with Medical Interpreter for Ms. Pokorski. Met with Pamala Hurry A. Sow and interpreter at bedside. Patient indicated that her wishes for Jaydalynn Olivero [phone] and Weldon Picking [phone] were clearly indicated on PART A: H.C. P.O.A. and that her wishes were clearly indicated on PART B: Living Will    Patient signed her Advance Directive in the presence of [place]; and volunteer witnesses Belva Chimes and Mariana Arn, all of whom have signed in the appropriate spaces on Part: C.    A copy of Ms. Bordenave A.D. was placed in her hard chart, a copy was emailed for scanning into her electronic medical record, the original and two copies were then returned to the patient.    [place], [place]. [phone].

## 2021-06-01 NOTE — Progress Notes (Signed)
Progress Note   Patient: Maria Collier TKZ:[id number] DOB: 12/29/1933 DOA: 05/31/2021     1 DOS: the patient was seen and examined on 06/01/2021   Brief hospital course: The patient is an 88 year old deaf Caucasian female with past medical history significant for but limited to hypertension, hyperlipidemia, chronic diastolic CHF, history of severe mitral regurgitation, tricuspid regurgitation, pulmonary hypertension, diabetes mellitus type 2, GERD as well as other comorbidities who presented with a chief complaint of dizziness and chest pain.  History is obtained using some language services yesterday per the admitting physician and she is about to take a bath around 11:30 PM when she reported feeling funny.  She complained of having a squeezing centralized chest pain that broke out into a sweat and she also fell due to dizziness but was able to catch herself and sit down.  She placed a cool rag over her head and called 911.  She associated symptoms of shortness of breath, cough, nausea and orthopnea.  At the beginning of this year she notes that she has been short of breath with activity and rest and she has to rest a couple times when walking any distance due to feeling out of breath.  She states the cough has been present intermittently from time to time but is not necessarily new and she has had chronic leg swelling and has not noticed any significant change.  She denies any fevers, loss of consciousness abdominal pain, palpitations and reports that she has intermittent chest pain and discomfort before but the symptoms were never this severe.  She was followed by cardiology outpatient setting and was in the process of being worked up for her valve [place] issues and was last seen in the cardiologist office on 05/10/2021 with plans for her to have a TEE without left and right heart cath for further evaluation.  At that time she is also started on furosemide 20 mg p.o. twice which she was advised to take  until last night when she called and she was instructed to only take once daily.  In the ED patient noted to be in A-fib with RVR with rates in the 140s to 160s and Cardizem was given.  Upon arrival to the ED rates ranged from 100s to 130s and her respirations were elevated.  Labs are significant for hemoglobin of 10.9 and potassium 3.1.  Influenza and COVID testing were negative and the chest x-ray was done which noted increased cardiomegaly without any acute cardiopulmonary disease.  She was started on a heparin drip as well as diltiazem drip and given 40 mg of p.o. potassium chloride and tried was asked to admit this patient.  Cardiology was consulted for further evaluation with her new onset A-fib with RVR and they recommended stopping IV Cardizem and starting IV amiodarone with bolus.  They recommended continuing her on Toprol and recommended continuing heparin for now and anticipation for right left heart cath which is being done today.  After the cath the plan is to switch her to Eliquis and the plan is then for her to have a TEE/DCCV on Friday, 06/02/2021.  The plan was for her to undergo right heart catheterization and coronary angiography to complete MitraClip evaluation.  Patient was to be scheduled for a TEE/DCCV but she converted to normal sinus rhythm on amiodarone and then cardiology is transitioning her to p.o. amiodarone with taper and continue Toprol.  Because she transition to normal sinus rhythm cardiology is planning on discharging tomorrow on anticoagulation with outpatient  TEE for MitraClip purposes.  Patient's right and left heart cath and coronary angiography was done and showed left ventricular end-diastolic pressure was normal and with normal coronary anatomy with normal left ventricular filling pressures with PCP WP of 7 mm per mercury and a right heart pressures of a PAP mean of 17 mm per mercury and a normal cardiac output index of 3.03.  Assessment and Plan: * Atrial fibrillation  with RVR ([place])- (present on admission) -Acute.   -Patient presented in atrial fibrillation with heart rates into the 160s.   -She had received Cardizem 10 mg IV in route and started on a Cardizem drip while in the ED which was then changed to IV amiodarone per cardiology.  -CHA2DS2-VASc score equal to at least 5 based off age, sex, HTN, and DM type II.   -She had been started on a Cardizem and heparin drip.  Cardiology have been formally consulted. -Admit to a progressive bed -Continue heparin per pharmacy and she will be changed to apixaban likely -Given her softer blood pressures Cardizem drip was transitioned to IV amiodarone -Patient spontaneously converted to normal sinus rhythm on IV amiodarone saw her TEE/DCCV will be postponed and she will no longer need a DCCV.  Patient will get a TEE as an outpatient for MitraClip purposes -Check TSH and was 1.540 -Goal to maintain at least potassium 4 and magnesium 2.  Will replace as needed. -Appreciate cardiology consultative services, will follow-up for further recommendations -Patient underwent a right and left heart catheterization and findings as delineated  Chronic kidney disease (CKD), stage IIIa- (present on admission) -On admission creatinine 1.18 with BUN 32.  -Baseline creatinine previously noted to be around 0.9-1.  -Has a slight AKI in the setting of her atrial fibrillation and BUN/creatinine is slightly worsened to 32/1.35 and trended up from admission -Currently she is getting KVO fluids and will need to come to monitor renal function carefully -Avoid further nephrotoxic medications, hypotension and dehydration; she will be undergoing a cardiac catheterization which may worsen her renal function -Continue monitor renal function carefully and trend daily  Normocytic anemia- (present on admission) --Acute.  Hemoglobin 10.9 on admission, but previously had been 12.6 g/dL 2 days ago.  Patient denies any reports of bleeding.  She had been  started on a heparin drip, but labs were obtained prior to doing so. -Repeat hemoglobin/hematocrit has improved and is now stable at 10.2/30.0 with it being 10.4/31.9 this a.m. -Continue to monitor for signs and symptoms of bleeding; currently no overt bleeding noted -Repeat CBC in a.m.  Chronic diastolic CHF (congestive heart failure) (HCC) -Last EF noted to be 60 to 65% with grade 3 diastolic function severe mitral regurgitation, moderate to severe tricuspid regurgitation, and pulmonary hypertension with. -Strict I&Os; patient is +695 mL since admission -Daily weights -The patient's BNP on admission was 597.6 -Cardiology is following and defer diuresis to them given that her renal function is slowly trending up  Elevated troponin and chest pain- (present on admission) -Acute.   -Patient reports having a squeezing chest pain starting last night, but had had on initial high-sensitivity troponin 29-> repeat 39.  -Suspect symptoms possibly secondary to an in setting of rapid A-fib. -Continue to monitor closely and cardiology is following; she is already on aspirin 81 mg p.o. daily, atorvastatin 20 g p.o. daily as well as metoprolol succinate 100 minutes p.o. daily -Further care per cardiology and she had normal coronaries noted on her cardiac catheterization today   Hypokalemia- (present on  admission) -Acute.  On admission potassium noted to be 3.1 and is now improved after potassium supplementation is now 4.2 on last check.  . -Continue to monitor and replace as needed -Check magnesium level and last was 2.1 -Repeat CMP in the a.m.  Severe mitral regurgitation  tricuspid regurgitation  pulmonary artery hypertension- (present on admission) -Severe mitral regurgitation, moderate to severe tricuspid regurgitation, and pulmonary artery hypertension with estimated RVSP 56.3 mmHg by echocardiogram from 04/2021.based off last echo from 04/2021.   -She has been being followed by cardiology with  plans for TEE and right and left heart catheterization study for further evaluation for need of transcatheter mitral edge-to-edge repair. -Further care per cardiology and they are arranging for the right and left heart cath today to determine further steps -Patient is being arranged for TEE in the outpatient setting as she is converted to normal sinus rhythm on the amiodarone -Cath showed a left ventricular end-diastolic pressure that was normal and a normal coronary artery anatomy no normal left ventricular filling pressures with PCP a BP of 70 mm per mercury and normal right heart Pressure with PAP of a mean of 17 mm per mercury.  She had a normal cardiac output index of 3.03 -We will defer further care to Cardiology recommendations  Diabetes mellitus without complication ([place]) -Home medication regimen includes metformin.  Last available hemoglobin A1c from 4 years ago was 6.4. -Hold metformin given that she is getting a cardiac cath and hold for at least 48 hours until after -Hemoglobin A1c is now 6.8 -Continue monitor CBGs per protocol and if necessary will place on sensitive neurologic sign scale insulin AC  Hypertension- (present on admission) -Blood pressures appear to be currently maintained 92/62-126/68.   -Home blood pressure regimen includes amlodipine 5 mg daily, furosemide 20 mg twice daily, metoprolol 100 mg daily. -Held current home regimen and will defer to cardiology for further recommendations -Last blood pressure reading was on the softer side at 105/95  Hyperlipidemia- (present on admission) -Continue atorvastatin 20 mg p.o. daily  Subjective: Seen and examined at bedside and she is deaf and unable to verbalize but I communicated with her through writing.  She states that she is feeling okay and denies any chest pain or shortness of breath.  Ready for her heart catheterization..  No other concerns or complaints at this time and feels relatively well.  Physical  Exam: Vitals:   06/01/21 1508 06/01/21 1523 06/01/21 1524 06/01/21 1538  BP: 108/61 94/75 94/75  (!) 105/95  Pulse:      Resp: (!) 23 18 14 20   Temp:      TempSrc:      SpO2: 94% 92% 94% 95%  Weight:      Height:       Examination: Physical Exam:  Constitutional: Thin elderly Caucasian female currently in no acute distress who is deaf; I communicated with her in writing Respiratory: Slightly diminished to auscultation bilaterally with coarse breath sounds, no wheezing, rales, rhonchi or crackles. Normal respiratory effort and patient is not tachypenic. No accessory muscle use.  Unlabored breathing and not wearing supplemental oxygen nasal cannula Cardiovascular: RRR, has a 4 out of 6 systolic murmur Abdomen: Soft, non-tender, non-distended.  Bowel sounds positive.  GU: Deferred. Skin: No rashes, lesions, ulcers limited skin evaluation. No induration; Warm and dry.  Neurologic: Patient is deaf   Data Reviewed:  I have personally reviewed and interpreted the patient's BMP and CBC.  Patient also did have an ABG done from  her art line during the cath  Patient's hemoglobin/hematocrit has been relatively stable but her BUNs/creatinine did slightly trend up to 32/1.35  Family Communication: No family currently at bedside  Disposition: Status is: Inpatient Remains inpatient appropriate because: We will need further cardiac clearance prior to safe discharge disposition  Planned Discharge Destination: Home  DVT prophylaxis; with anticoagulant heparin drip and now being transitioned to apixaban by cardiology  Author: Raiford Noble, DO [facility]  06/01/2021 4:54 PM  For on call review [email or web address].

## 2021-06-01 NOTE — Progress Notes (Signed)
ANTICOAGULATION CONSULT NOTE - Follow Up Consult  Pharmacy Consult for apixaban Indication: atrial fibrillation  Allergies  Allergen Reactions   Altace [Ramipril] Cough   Hyzaar [Losartan Potassium-Hctz] Cough   Hydrocodone Cough         Patient Measurements: Height: 5\' 2"  (157.5 cm) Weight: 56.8 kg (125 lb 3.5 oz) IBW/kg (Calculated) : 50.1  Vital Signs: Temp: 98.6 F (37 C) (02/23 1425) Temp Source: Oral (02/23 1425) BP: 128/60 (02/23 1425) Pulse Rate: 65 (02/23 1425)  Labs: Recent Labs    05/31/21 0226 05/31/21 0400 05/31/21 0844 05/31/21 1100 05/31/21 2021 06/01/21 0346  HGB 10.9*  --  12.5  --   --  10.4*  HCT 34.6*  --  39.6  --   --  31.9*  PLT 161  --   --   --   --  146*  APTT 26  --   --   --   --   --   LABPROT 13.3  --   --   --   --   --   INR 1.0  --   --   --   --   --   HEPARINUNFRC  --   --   --  0.67 0.67 0.50  CREATININE 1.18*  --   --   --   --  1.35*  TROPONINIHS 29* 39*  --   --   --   --     Estimated Creatinine Clearance: 24.1 mL/min (A) (by C-G formula based on SCr of 1.35 mg/dL (H)).  Assessment: 88yo female c/o dizziness and palpitations, EMS found pt to be in Afib w/ RVR with rate to 160s >> started on heparin. No AC PTA.  Patient is now s/p cath and pharmacy has been consulted to start apixaban 8 hours post cath. Patient is ?88 years old and ?60 kg so she qualifies for reduced dose apixaban at 2.5 mg twice daily. Sheath removed at 1400, so plan to start at 2200 tonight.   Goal of Therapy:  Monitor platelets by anticoagulation protocol: Yes   Plan:  Discontinue heparin  Start apixaban 2.5 mg PO twice daily tonight at 2200  Monitor for s/sx of bleeding  Thank you for including pharmacy in the care of this patient.  Zenaida Deed, PharmD PGY1 Acute Care Pharmacy Resident  Phone: [phone] 06/01/2021  2:41 PM  Please check [email or web address] for unit-specific pharmacy phone numbers.

## 2021-06-01 NOTE — Progress Notes (Addendum)
Progress Note  Patient Name: Maria Collier Date of Encounter: 06/01/2021  [facility] Cardiologist: Fransico Him, MD   Subjective   Feeling well. No chest pain, sob or palpitations.    Inpatient Medications    Scheduled Meds:  aspirin EC  81 mg Oral Daily   atorvastatin  20 mg Oral Daily   metoprolol succinate  100 mg Oral Daily   sodium chloride flush  3 mL Intravenous Q12H   sodium chloride flush  3 mL Intravenous Q12H   Continuous Infusions:  sodium chloride     sodium chloride 10 mL/hr at 06/01/21 0706   amiodarone 30 mg/hr (05/31/21 2348)   heparin 900 Units/hr (06/01/21 0421)   PRN Meds: sodium chloride, acetaminophen **OR** acetaminophen, albuterol, ondansetron **OR** ondansetron (ZOFRAN) IV, sodium chloride flush   Vital Signs    Vitals:   05/31/21 1655 05/31/21 2030 06/01/21 0050 06/01/21 0549  BP: (!) 117/58 (!) 122/57 102/64 (!) 117/48  Pulse: 68 77 70 67  Resp: 18 19 20 16   Temp: 97.6 F (36.4 C) 98 F (36.7 C) 98.9 F (37.2 C) 98.5 F (36.9 C)  TempSrc: Oral Oral Oral Oral  SpO2: 100% 96% 97% 93%  Weight:    56.8 kg  Height:        Intake/Output Summary (Last 24 hours) at 06/01/2021 0823 Last data filed at 05/31/2021 2000 Gross per 24 hour  Intake 573 ml  Output --  Net 573 ml   Last 3 Weights 06/01/2021 05/31/2021 05/10/2021  Weight (lbs) 125 lb 3.5 oz 126 lb 9.6 oz 126 lb 9.6 oz  Weight (kg) 56.8 kg 57.425 kg 57.425 kg      Telemetry    NSR - Personally Reviewed  ECG    N/A  Physical Exam   GEN: No acute distress.   Neck: No JVD Cardiac: GBT,5/1 systolic murmurs, rubs, or gallops.  Respiratory: Clear to auscultation bilaterally. GI: Soft, nontender, non-distended  MS: No edema; No deformity. Neuro:  Nonfocal  Psych: Normal affect   Labs    High Sensitivity Troponin:   Recent Labs  Lab 05/31/21 0226 05/31/21 0400  TROPONINIHS 29* 39*     Chemistry Recent Labs  Lab 05/29/21 0754 05/31/21 0226 05/31/21 0844  06/01/21 0346  NA 143 139  --  135  K 3.5 3.1*  --  4.5  CL 94* 95*  --  97*  CO2 32* 32  --  32  GLUCOSE 154* 177*  --  141*  BUN 31* 32*  --  32*  CREATININE 1.09* 1.18*  --  1.35*  CALCIUM 9.9 10.2  --  9.3  MG  --   --  1.8  --   GFRNONAA  --  45*  --  39*  ANIONGAP  --  12  --  6    Hematology Recent Labs  Lab 05/29/21 0754 05/31/21 0226 05/31/21 0844 06/01/21 0346  WBC 7.3 8.0  --  6.9  RBC 4.02 3.51*  --  3.26*  HGB 12.6 10.9* 12.5 10.4*  HCT 38.3 34.6* 39.6 31.9*  MCV 95 98.6  --  97.9  MCH 31.3 31.1  --  31.9  MCHC 32.9 31.5  --  32.6  RDW 11.9 12.8  --  13.0  PLT 189 161  --  146*   Thyroid  Recent Labs  Lab 05/31/21 0844  TSH 1.540    BNP Recent Labs  Lab 05/31/21 0226  BNP 597.6*     Radiology  DG Chest Port 1 View  Result Date: 05/31/2021 CLINICAL DATA:  Chest pain. EXAM: PORTABLE CHEST 1 VIEW COMPARISON:  May 02, 2010 FINDINGS: There is moderate to marked severity enlargement of the cardiac silhouette which is increased in severity when compared to the prior study. Marked severity curvilinear cardiac related calcification is seen overlying the medial aspect of the left lung base. Mildly decreased lung volumes are noted. Both lungs are clear. Multilevel degenerative changes are seen throughout the thoracic spine. IMPRESSION: Cardiomegaly, as described above, without acute cardiopulmonary disease. Electronically Signed   By: Virgina Norfolk M.D.   On: 05/31/2021 02:54    Cardiac Studies   Echo 04/24/21 1. Left ventricular ejection fraction, by estimation, is 60 to 65%. The  left ventricle has normal function. The left ventricle has no regional  wall motion abnormalities. There is mild left ventricular hypertrophy.  Left ventricular diastolic parameters  are consistent with Grade III diastolic dysfunction (restrictive).  Elevated left atrial pressure.   2. Right ventricular systolic function is normal. The right ventricular  size is mildly  enlarged. There is moderately elevated pulmonary artery  systolic pressure. The estimated right ventricular systolic pressure is  35.6 mmHg.   3. Left atrial size was severely dilated.   4. Right atrial size was mildly dilated.   5. The mitral valve is degenerative. Severe mitral valve regurgitation.  Severe mitral annular calcification.   6. Pulmonic valve regurgitation is moderate.   7. The tricuspid valve is abnormal. Tricuspid valve regurgitation is  moderate to severe.   8. The aortic valve is tricuspid. Aortic valve regurgitation is trivial.  Aortic valve sclerosis is present, with no evidence of aortic valve  stenosis.   9. The inferior vena cava is normal in size with greater than 50%  respiratory variability, suggesting right atrial pressure of 3 mmHg.   Patient Profile     88 y.o. 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 Joseph Berkshire.  Assessment & Plan    New Onset Atrial Fibrillation with RVR Patient presented with new palpitations, dizziness, and presycnope likely secondary to new onset atrial fibrillation with RVR which is likely the result of her severe MR and severe left atrium dilation.  Recent Echo showed normal LV function.  She was started on IV Cardizem but remains in rapid atrial fibrillation with soft BP. Switched to IV amiodarone. Converted to sinus yesterday around 2pm. Maintaining sinus rhythm. Change to PO amiodarone 400mg  BID.  - TSH normal  -  CHA2DS2-VASc = 4 (HTN, DM, age x2, female). On heparin. Cath today. Change to Eliquis post cath. - Continue Toprol XL 100mg  qd  2. Severe Mitral Regurgitation 3. Severe Tricuspid Regurgitation 4. Mitral Annular Calcifications - Recent Echo last month showed severe mitral  annular calcification with severe MR and moderate to severe TR.  - Plan for R/L heart cath today to determined further step.I will keep for TEE/DCCV tomorrow until cath. May be TEE only tomorrow.  - Continue ASA, statin and BB  5. HTN - BP stable now   6. AKI - Scr 1.09>>1.18>>1.35. Getting KVO fluids.  - Follow closely   7. DM - Hgb A1c 6.8 - Per primary team     For questions or updates, please contact [place] Please consult [email or web address] for contact info under  SignedCrista Luria [place], PA  06/01/2021, 8:23 AM    ATTENDING ATTESTATION:  After conducting a review of all available clinical information with the care team, interviewing the patient, and performing a physical exam, I agree with the findings and plan described in this note.   GEN: No acute distress.   Cardiac: RRR, 4/6 holosystolic murmur  Respiratory: Clear to auscultation bilaterally. GI: Soft, nontender, non-distended  MS: No edema; No deformity. Neuro:  Nonfocal  Vasc:  +2 radial pulses  Patient converted to normal sinus rhythm on amiodarone.  We will transition to p.o. amiodarone with taper.  Continue Toprol.  We will for for right heart catheterization and coronary angiography study today.  We will plan on discharge tomorrow on anticoagulation.  We will plan on outpatient TEE for MitraClip purposes already scheduled.  Lenna Sciara, MD Pager [phone]

## 2021-06-01 NOTE — H&P (View-Only) (Signed)
Progress Note  Patient Name: Maria Collier Date of Encounter: 06/01/2021  [facility] Cardiologist: Fransico Him, MD   Subjective   Feeling well. No chest pain, sob or palpitations.    Inpatient Medications    Scheduled Meds:  aspirin EC  81 mg Oral Daily   atorvastatin  20 mg Oral Daily   metoprolol succinate  100 mg Oral Daily   sodium chloride flush  3 mL Intravenous Q12H   sodium chloride flush  3 mL Intravenous Q12H   Continuous Infusions:  sodium chloride     sodium chloride 10 mL/hr at 06/01/21 0706   amiodarone 30 mg/hr (05/31/21 2348)   heparin 900 Units/hr (06/01/21 0421)   PRN Meds: sodium chloride, acetaminophen **OR** acetaminophen, albuterol, ondansetron **OR** ondansetron (ZOFRAN) IV, sodium chloride flush   Vital Signs    Vitals:   05/31/21 1655 05/31/21 2030 06/01/21 0050 06/01/21 0549  BP: (!) 117/58 (!) 122/57 102/64 (!) 117/48  Pulse: 68 77 70 67  Resp: 18 19 20 16   Temp: 97.6 F (36.4 C) 98 F (36.7 C) 98.9 F (37.2 C) 98.5 F (36.9 C)  TempSrc: Oral Oral Oral Oral  SpO2: 100% 96% 97% 93%  Weight:    56.8 kg  Height:        Intake/Output Summary (Last 24 hours) at 06/01/2021 0823 Last data filed at 05/31/2021 2000 Gross per 24 hour  Intake 573 ml  Output --  Net 573 ml   Last 3 Weights 06/01/2021 05/31/2021 05/10/2021  Weight (lbs) 125 lb 3.5 oz 126 lb 9.6 oz 126 lb 9.6 oz  Weight (kg) 56.8 kg 57.425 kg 57.425 kg      Telemetry    NSR - Personally Reviewed  ECG    N/A  Physical Exam   GEN: No acute distress.   Neck: No JVD Cardiac: QPY,1/9 systolic murmurs, rubs, or gallops.  Respiratory: Clear to auscultation bilaterally. GI: Soft, nontender, non-distended  MS: No edema; No deformity. Neuro:  Nonfocal  Psych: Normal affect   Labs    High Sensitivity Troponin:   Recent Labs  Lab 05/31/21 0226 05/31/21 0400  TROPONINIHS 29* 39*     Chemistry Recent Labs  Lab 05/29/21 0754 05/31/21 0226 05/31/21 0844  06/01/21 0346  NA 143 139  --  135  K 3.5 3.1*  --  4.5  CL 94* 95*  --  97*  CO2 32* 32  --  32  GLUCOSE 154* 177*  --  141*  BUN 31* 32*  --  32*  CREATININE 1.09* 1.18*  --  1.35*  CALCIUM 9.9 10.2  --  9.3  MG  --   --  1.8  --   GFRNONAA  --  45*  --  39*  ANIONGAP  --  12  --  6    Hematology Recent Labs  Lab 05/29/21 0754 05/31/21 0226 05/31/21 0844 06/01/21 0346  WBC 7.3 8.0  --  6.9  RBC 4.02 3.51*  --  3.26*  HGB 12.6 10.9* 12.5 10.4*  HCT 38.3 34.6* 39.6 31.9*  MCV 95 98.6  --  97.9  MCH 31.3 31.1  --  31.9  MCHC 32.9 31.5  --  32.6  RDW 11.9 12.8  --  13.0  PLT 189 161  --  146*   Thyroid  Recent Labs  Lab 05/31/21 0844  TSH 1.540    BNP Recent Labs  Lab 05/31/21 0226  BNP 597.6*     Radiology  DG Chest Port 1 View  Result Date: 05/31/2021 CLINICAL DATA:  Chest pain. EXAM: PORTABLE CHEST 1 VIEW COMPARISON:  May 02, 2010 FINDINGS: There is moderate to marked severity enlargement of the cardiac silhouette which is increased in severity when compared to the prior study. Marked severity curvilinear cardiac related calcification is seen overlying the medial aspect of the left lung base. Mildly decreased lung volumes are noted. Both lungs are clear. Multilevel degenerative changes are seen throughout the thoracic spine. IMPRESSION: Cardiomegaly, as described above, without acute cardiopulmonary disease. Electronically Signed   By: Virgina Norfolk M.D.   On: 05/31/2021 02:54    Cardiac Studies   Echo 04/24/21 1. Left ventricular ejection fraction, by estimation, is 60 to 65%. The  left ventricle has normal function. The left ventricle has no regional  wall motion abnormalities. There is mild left ventricular hypertrophy.  Left ventricular diastolic parameters  are consistent with Grade III diastolic dysfunction (restrictive).  Elevated left atrial pressure.   2. Right ventricular systolic function is normal. The right ventricular  size is mildly  enlarged. There is moderately elevated pulmonary artery  systolic pressure. The estimated right ventricular systolic pressure is  95.6 mmHg.   3. Left atrial size was severely dilated.   4. Right atrial size was mildly dilated.   5. The mitral valve is degenerative. Severe mitral valve regurgitation.  Severe mitral annular calcification.   6. Pulmonic valve regurgitation is moderate.   7. The tricuspid valve is abnormal. Tricuspid valve regurgitation is  moderate to severe.   8. The aortic valve is tricuspid. Aortic valve regurgitation is trivial.  Aortic valve sclerosis is present, with no evidence of aortic valve  stenosis.   9. The inferior vena cava is normal in size with greater than 50%  respiratory variability, suggesting right atrial pressure of 3 mmHg.   Patient Profile     88 y.o. 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 Joseph Berkshire.  Assessment & Plan    New Onset Atrial Fibrillation with RVR Patient presented with new palpitations, dizziness, and presycnope likely secondary to new onset atrial fibrillation with RVR which is likely the result of her severe MR and severe left atrium dilation.  Recent Echo showed normal LV function.  She was started on IV Cardizem but remains in rapid atrial fibrillation with soft BP. Switched to IV amiodarone. Converted to sinus yesterday around 2pm. Maintaining sinus rhythm. Change to PO amiodarone 400mg  BID.  - TSH normal  -  CHA2DS2-VASc = 4 (HTN, DM, age x2, female). On heparin. Cath today. Change to Eliquis post cath. - Continue Toprol XL 100mg  qd  2. Severe Mitral Regurgitation 3. Severe Tricuspid Regurgitation 4. Mitral Annular Calcifications - Recent Echo last month showed severe mitral  annular calcification with severe MR and moderate to severe TR.  - Plan for R/L heart cath today to determined further step.I will keep for TEE/DCCV tomorrow until cath. May be TEE only tomorrow.  - Continue ASA, statin and BB  5. HTN - BP stable now   6. AKI - Scr 1.09>>1.18>>1.35. Getting KVO fluids.  - Follow closely   7. DM - Hgb A1c 6.8 - Per primary team     For questions or updates, please contact [place] Please consult [email or web address] for contact info under  SignedCrista Luria [place], PA  06/01/2021, 8:23 AM    ATTENDING ATTESTATION:  After conducting a review of all available clinical information with the care team, interviewing the patient, and performing a physical exam, I agree with the findings and plan described in this note.   GEN: No acute distress.   Cardiac: RRR, 4/6 holosystolic murmur  Respiratory: Clear to auscultation bilaterally. GI: Soft, nontender, non-distended  MS: No edema; No deformity. Neuro:  Nonfocal  Vasc:  +2 radial pulses  Patient converted to normal sinus rhythm on amiodarone.  We will transition to p.o. amiodarone with taper.  Continue Toprol.  We will for for right heart catheterization and coronary angiography study today.  We will plan on discharge tomorrow on anticoagulation.  We will plan on outpatient TEE for MitraClip purposes already scheduled.  Lenna Sciara, MD Pager [phone]

## 2021-06-01 NOTE — Plan of Care (Signed)
  Problem: Activity: Goal: Risk for activity intolerance will decrease Outcome: Progressing   

## 2021-06-02 ENCOUNTER — Encounter: Payer: Self-pay | Admitting: Cardiology

## 2021-06-02 ENCOUNTER — Other Ambulatory Visit (HOSPITAL_COMMUNITY): Payer: Medicare HMO

## 2021-06-02 ENCOUNTER — Encounter (HOSPITAL_COMMUNITY)
Admission: EM | Disposition: A | Discharge status: Home / Self Care | Payer: Self-pay | Source: Home / Self Care | Attending: Internal Medicine

## 2021-06-02 ENCOUNTER — Encounter (HOSPITAL_COMMUNITY): Payer: Self-pay | Admitting: Cardiology

## 2021-06-02 DIAGNOSIS — I4891 Unspecified atrial fibrillation: Secondary | ICD-10-CM | POA: Diagnosis not present

## 2021-06-02 DIAGNOSIS — I361 Nonrheumatic tricuspid (valve) insufficiency: Secondary | ICD-10-CM

## 2021-06-02 DIAGNOSIS — E119 Type 2 diabetes mellitus without complications: Secondary | ICD-10-CM | POA: Diagnosis not present

## 2021-06-02 DIAGNOSIS — I5032 Chronic diastolic (congestive) heart failure: Secondary | ICD-10-CM | POA: Diagnosis not present

## 2021-06-02 DIAGNOSIS — R778 Other specified abnormalities of plasma proteins: Secondary | ICD-10-CM | POA: Diagnosis not present

## 2021-06-02 LAB — COMPREHENSIVE METABOLIC PANEL
ALT: 15 U/L (ref 0–44)
AST: 20 U/L (ref 15–41)
Albumin: 3 g/dL — ABNORMAL LOW (ref 3.5–5.0)
Alkaline Phosphatase: 18 U/L — ABNORMAL LOW (ref 38–126)
Anion gap: 8 (ref 5–15)
BUN: 26 mg/dL — ABNORMAL HIGH (ref 8–23)
CO2: 27 mmol/L (ref 22–32)
Calcium: 9.3 mg/dL (ref 8.9–10.3)
Chloride: 103 mmol/L (ref 98–111)
Creatinine, Ser: 1.16 mg/dL — ABNORMAL HIGH (ref 0.44–1.00)
GFR, Estimated: 46 mL/min — ABNORMAL LOW (ref >60)
Glucose, Bld: 116 mg/dL — ABNORMAL HIGH (ref 70–99)
Potassium: 4.6 mmol/L (ref 3.5–5.1)
Sodium: 138 mmol/L (ref 135–145)
Total Bilirubin: 0.4 mg/dL (ref 0.3–1.2)
Total Protein: 5.8 g/dL — ABNORMAL LOW (ref 6.5–8.1)

## 2021-06-02 LAB — CBC WITH DIFFERENTIAL/PLATELET
Abs Immature Granulocytes: 0.02 10*3/uL (ref 0.00–0.07)
Basophils Absolute: 0.1 10*3/uL (ref 0.0–0.1)
Basophils Relative: 1 %
Eosinophils Absolute: 0.1 10*3/uL (ref 0.0–0.5)
Eosinophils Relative: 1 %
HCT: 32.9 % — ABNORMAL LOW (ref 36.0–46.0)
Hemoglobin: 10.5 g/dL — ABNORMAL LOW (ref 12.0–15.0)
Immature Granulocytes: 0 %
Lymphocytes Relative: 15 %
Lymphs Abs: 1.3 10*3/uL (ref 0.7–4.0)
MCH: 31.3 pg (ref 26.0–34.0)
MCHC: 31.9 g/dL (ref 30.0–36.0)
MCV: 97.9 fL (ref 80.0–100.0)
Monocytes Absolute: 0.7 10*3/uL (ref 0.1–1.0)
Monocytes Relative: 8 %
Neutro Abs: 6.7 10*3/uL (ref 1.7–7.7)
Neutrophils Relative %: 75 %
Platelets: 148 10*3/uL — ABNORMAL LOW (ref 150–400)
RBC: 3.36 MIL/uL — ABNORMAL LOW (ref 3.87–5.11)
RDW: 13 % (ref 11.5–15.5)
WBC: 8.9 10*3/uL (ref 4.0–10.5)
nRBC: 0 % (ref 0.0–0.2)

## 2021-06-02 LAB — PHOSPHORUS: Phosphorus: 3.9 mg/dL (ref 2.5–4.6)

## 2021-06-02 LAB — MAGNESIUM: Magnesium: 1.9 mg/dL (ref 1.7–2.4)

## 2021-06-02 SURGERY — CANCELLED PROCEDURE

## 2021-06-02 MED ORDER — APIXABAN 2.5 MG PO TABS
2.5000 mg | ORAL_TABLET | Freq: Two times a day (BID) | ORAL | 0 refills | Status: AC
Start: 2021-06-02 — End: ?

## 2021-06-02 MED ORDER — AMIODARONE HCL 200 MG PO TABS
ORAL_TABLET | ORAL | 0 refills | Status: DC
Start: 2021-06-02 — End: 2022-04-17

## 2021-06-02 MED ORDER — FUROSEMIDE 20 MG PO TABS: 20.0000 mg | ORAL_TABLET | Freq: Every day | ORAL | 0 refills | Status: DC | PRN

## 2021-06-02 MED ORDER — METFORMIN HCL ER 500 MG PO TB24: 500.0000 mg | ORAL_TABLET | Freq: Every day | ORAL | Status: DC

## 2021-06-02 MED ORDER — ONDANSETRON HCL 4 MG PO TABS: 4.0000 mg | ORAL_TABLET | Freq: Four times a day (QID) | ORAL | 0 refills | Status: DC | PRN

## 2021-06-02 MED ORDER — ATORVASTATIN CALCIUM 20 MG PO TABS: 20.0000 mg | ORAL_TABLET | Freq: Every day | ORAL | 0 refills | Status: DC

## 2021-06-02 MED FILL — Heparin Sodium (Porcine) Inj 1000 Unit/ML: INTRAMUSCULAR | Qty: 10 | Status: AC

## 2021-06-02 NOTE — Discharge Summary (Signed)
Physician Discharge Summary   Patient: Maria Collier MRN: [id number] DOB: 11-21-1933  Admit date:     05/31/2021  Discharge date: 06/02/21  Discharge Physician: Kerney Elbe   PCP: Kelton Pillar, MD   Recommendations at discharge:   Follow-up with cardiology within 1 to 2 weeks and patient has an outpatient TEE arranged Follow-up with PCP within 1 to 2 weeks and obtain a CBC, CMP, mag, Phos  Discharge Diagnoses: Principal Problem:   Atrial fibrillation with RVR ([name]) Active Problems:   Hyperlipidemia   Hypertension   Diabetes mellitus without complication ([name])   Severe mitral regurgitation  tricuspid regurgitation  pulmonary artery hypertension   Tricuspid regurgitation   Hypokalemia   Elevated troponin and chest pain   Chronic diastolic CHF (congestive heart failure) (HCC)   Normocytic anemia   Chronic kidney disease (CKD), stage IIIa  Resolved Problems:   * No resolved hospital problems. [facility] Course: The patient is an 88 year old deaf Caucasian female with past medical history significant for but limited to hypertension, hyperlipidemia, chronic diastolic CHF, history of severe mitral regurgitation, tricuspid regurgitation, pulmonary hypertension, diabetes mellitus type 2, GERD as well as other comorbidities who presented with a chief complaint of dizziness and chest pain.  History is obtained using some language services yesterday per the admitting physician and she is about to take a bath around 11:30 PM when she reported feeling funny.  She complained of having a squeezing centralized chest pain that broke out into a sweat and she also fell due to dizziness but was able to catch herself and sit down.  She placed a cool rag over her head and called 911.  She associated symptoms of shortness of breath, cough, nausea and orthopnea.  At the beginning of this year she notes that she has been short of breath with activity and rest and she has to rest a couple times  when walking any distance due to feeling out of breath.  She states the cough has been present intermittently from time to time but is not necessarily new and she has had chronic leg swelling and has not noticed any significant change.  She denies any fevers, loss of consciousness abdominal pain, palpitations and reports that she has intermittent chest pain and discomfort before but the symptoms were never this severe.  She was followed by cardiology outpatient setting and was in the process of being worked up for her valve Buehler issues and was last seen in the cardiologist office on 05/10/2021 with plans for her to have a TEE without left and right heart cath for further evaluation.  At that time she is also started on furosemide 20 mg p.o. twice which she was advised to take until last night when she called and she was instructed to only take once daily.  In the ED patient noted to be in A-fib with RVR with rates in the 140s to 160s and Cardizem was given.  Upon arrival to the ED rates ranged from 100s to 130s and her respirations were elevated.  Labs are significant for hemoglobin of 10.9 and potassium 3.1.  Influenza and COVID testing were negative and the chest x-ray was done which noted increased cardiomegaly without any acute cardiopulmonary disease.  She was started on a heparin drip as well as diltiazem drip and given 40 mg of p.o. potassium chloride and tried was asked to admit this patient.  Cardiology was consulted for further evaluation with her new onset A-fib with RVR  and they recommended stopping IV Cardizem and starting IV amiodarone with bolus.  They recommended continuing her on Toprol and recommended continuing heparin for now and anticipation for right left heart cath which is being done today.  After the cath the plan is to switch her to Eliquis and the plan is then for her to have a TEE/DCCV on Friday, 06/02/2021.  The plan was for her to undergo right heart catheterization and coronary  angiography to complete MitraClip evaluation.  Patient was to be scheduled for a TEE/DCCV but she converted to normal sinus rhythm on amiodarone and then cardiology is transitioning her to p.o. amiodarone with taper and continue Toprol.  Because she transition to normal sinus rhythm cardiology is planning on discharging tomorrow on anticoagulation with outpatient TEE for MitraClip purposes.  Patient's right and left heart cath and coronary angiography was done and showed left ventricular end-diastolic pressure was normal and with normal coronary anatomy with normal left ventricular filling pressures with PCP WP of 7 mm per mercury and a right heart pressures of a PAP mean of 17 mm per mercury and a normal cardiac output index of 3.03.  Patient was supposed to go for TEE today however the structural reader was not available for TEE MitraClip today so they are recommending outpatient TEE early next week on Tuesday.  She remained in normal sinus rhythm and cardiology recommended transitioning her to p.o. amiodarone taper which has been done.  She is improved and will need to follow-up with PCP and cardiology in outpatient setting within 1 to 2 weeks.  Assessment and Plan: * Atrial fibrillation with RVR ([name])- (present on admission) -Acute.   -Patient presented in atrial fibrillation with heart rates into the 160s.   -She had received Cardizem 10 mg IV in route and started on a Cardizem drip while in the ED which was then changed to IV amiodarone per cardiology.  -CHA2DS2-VASc score equal to at least 5 based off age, sex, HTN, and DM type II.   -She had been started on a Cardizem and heparin drip.  Cardiology have been formally consulted. -Admit to a progressive bed -Continue heparin per pharmacy and she will be changed to apixaban likely -Given her softer blood pressures Cardizem drip was transitioned to IV amiodarone -Patient spontaneously converted to normal sinus rhythm on IV amiodarone saw her  TEE/DCCV will be postponed and she will no longer need a DCCV.  Patient will get a TEE as an outpatient for MitraClip purposes -Check TSH and was 1.540 -Goal to maintain at least potassium 4 and magnesium 2.  Will replace as needed. -[place] cardiology consultative services, will follow-up for further recommendations -Patient underwent a right and left heart catheterization and findings as delineated -Cardiology recommending p.o. amiodarone taper and continuing apixaban.  Plan is for TEE on Tuesday for MitraClip purposes  Chronic kidney disease (CKD), stage IIIa- (present on admission) -On admission creatinine 1.18 with BUN 32.  -Baseline creatinine previously noted to be around 0.9-1.  -Has a slight AKI in the setting of her atrial fibrillation and BUN/creatinine is slightly worsened to 32/1.35 and trended up from admission and is now improved and is 26/1.16 -Currently she is getting KVO fluids and will need to come to monitor renal function carefully -Avoid further nephrotoxic medications, hypotension and dehydration; she will be undergoing a cardiac catheterization which may worsen her renal function -Continue monitor renal function carefully and trend daily  Normocytic anemia- (present on admission) --Acute.  Hemoglobin 10.9 on admission, but  previously had been 12.6 g/dL 2 days ago.  Patient denies any reports of bleeding.  She had been started on a heparin drip, but labs were obtained prior to doing so. -Repeat hemoglobin/hematocrit has improved and is now stable at 10.2/30.0 with it being 10.4/31.9 yesterday AM and today it is relatively stable at 10.5/32.9 -Continue to monitor for signs and symptoms of bleeding; currently no overt bleeding noted -Repeat CBC in a.m.  Chronic diastolic CHF (congestive heart failure) (HCC) -Last EF noted to be 60 to 65% with grade 3 diastolic function severe mitral regurgitation, moderate to severe tricuspid regurgitation, and pulmonary hypertension  with. -Strict I&Os; patient is +695 mL since admission -Daily weights -The patient's BNP on admission was 597.6 -Cardiology is following and defer diuresis to them given that her renal function is slowly trending up  Elevated troponin and chest pain- (present on admission) -Acute.   -Patient reports having a squeezing chest pain starting last night, but had had on initial high-sensitivity troponin 29-> repeat 39.  -Suspect symptoms possibly secondary to an in setting of rapid A-fib. -Continue to monitor closely and cardiology is following; she is already on aspirin 81 mg p.o. daily, atorvastatin 20 g p.o. daily as well as metoprolol succinate 100 minutes p.o. daily -Further care per cardiology and she had normal coronaries noted on her cardiac catheterization yesterday and will follow-up with them in outpatient setting   Hypokalemia- (present on admission) -Acute.  On admission potassium noted to be 3.1 and is now improved after potassium supplementation is now 4.6 -Continue to monitor and replace as needed -Check magnesium level and last was 1.9 -Repeat CMP in the a.m.  Tricuspid regurgitation- (present on admission) -Moderate to severe by echo 04/2021 -Cardiology feels that his severe tricuspid regurg and patient also has severe mitral annular calcification with severe mitral regurg and moderate to severe tricuspid regurg -Cardiology is planning for right and left heart cath which was done today and they are arranging for TEE for MitraClip purposes as an outpatient given that it cannot be done today -Cardiology recommends continuing aspirin, statin, beta-blocker  Severe mitral regurgitation  tricuspid regurgitation  pulmonary artery hypertension- (present on admission) -Severe mitral regurgitation, moderate to severe tricuspid regurgitation, and pulmonary artery hypertension with estimated RVSP 56.3 mmHg by echocardiogram from 04/2021.based off last echo from 04/2021.   -She has been being  followed by cardiology with plans for TEE and right and left heart catheterization study for further evaluation for need of transcatheter mitral edge-to-edge repair. -Further care per cardiology and they are arranging for the right and left heart cath today to determine further steps -Patient is being arranged for TEE in the outpatient setting as she is converted to normal sinus rhythm on the amiodarone -Cath showed a left ventricular end-diastolic pressure that was normal and a normal coronary artery anatomy no normal left ventricular filling pressures with PCP a BP of 70 mm per mercury and normal right heart Pressure with PAP of a mean of 17 mm per mercury.  She had a normal cardiac output index of 3.03 -We will defer further care to Cardiology recommendations and they are recommending outpatient TEE early next week and continue amiodarone taper as written.  Diabetes mellitus without complication ([place]) -Home medication regimen includes metformin.  Last available hemoglobin A1c from 4 years ago was 6.4. -Hold metformin given that she is getting a cardiac cath and hold for at least 48 hours until after -Hemoglobin A1c is now 6.8 -Continue monitor CBGs per  protocol and if necessary will place on sensitive neurologic sign scale insulin AC  Hypertension- (present on admission) -Blood pressures appear to be currently maintained 92/62-126/68.   -Home blood pressure regimen includes amlodipine 5 mg daily, furosemide 20 mg twice daily, metoprolol 100 mg daily.  Resume home medications with amlodipine and furosemide 20 mg daily as needed as well as hydrochlorothiazide at discharge per cardiology recommendation -Held current home regimen and will defer to cardiology for further recommendations -Last blood pressure reading was on the softer side at 163/65  Hyperlipidemia- (present on admission) -Continue atorvastatin 20 mg p.o. daily   Pain control - [place] Controlled Substance Reporting System  database was reviewed. and patient was instructed, not to drive, operate heavy machinery, perform activities at heights, swimming or participation in water activities or provide baby-sitting services while on Pain, Sleep and Anxiety Medications; until their outpatient Physician has advised to do so again. Also recommended to not to take more than prescribed Pain, Sleep and Anxiety Medications.   Consultants: Cardiology Procedures performed: Right Heart Cath  Disposition: Home Diet recommendation:  Discharge Diet Orders (From admission, onward)     Start     Ordered   06/02/21 0000  Diet - low sodium heart healthy        06/02/21 0947           Cardiac and Carb modified diet  DISCHARGE MEDICATION: Allergies as of 06/02/2021       Reactions   Altace [ramipril] Cough   Hyzaar [losartan Potassium-hctz] Cough   Hydrocodone Cough           Medication List     STOP taking these medications    aspirin EC 81 MG tablet   simvastatin 40 MG tablet Commonly known as: ZOCOR       TAKE these medications    acetaminophen 500 MG tablet Commonly known as: TYLENOL Take 500 mg by mouth daily as needed for mild pain or headache.   Adult One Daily Gummies Chew Chew 1 tablet by mouth daily.   amiodarone 200 MG tablet Commonly known as: PACERONE Take 2 tablets (400 mg total) by mouth 2 (two) times daily for 14 days, THEN 1 tablet (200 mg total) 2 (two) times daily for 14 days, THEN 1 tablet (200 mg total) daily for 14 days. Start taking on: June 02, 2021   amLODipine 5 MG tablet Commonly known as: NORVASC Take 5 mg by mouth every morning.   apixaban 2.5 MG Tabs tablet Commonly known as: ELIQUIS Take 1 tablet (2.5 mg total) by mouth 2 (two) times daily.   atorvastatin 20 MG tablet Commonly known as: LIPITOR Take 1 tablet (20 mg total) by mouth daily.   FISH OIL PO Take 2 tablets by mouth daily. OTC gummy   FOLIC ACID PO Take 1 tablet by mouth daily. OTC gummy    furosemide 20 MG tablet Commonly known as: LASIX Take 1 tablet (20 mg total) by mouth daily as needed. What changed:  when to take this reasons to take this   hydrochlorothiazide 25 MG tablet Commonly known as: HYDRODIURIL Take 25 mg by mouth every morning.   IRON PO Take 1 tablet by mouth daily. OTC gummy   metFORMIN 500 MG 24 hr tablet Commonly known as: GLUCOPHAGE-XR Take 1 tablet (500 mg total) by mouth daily with supper. Start taking on: June 04, 2021 What changed: These instructions start on June 04, 2021. If you are unsure what to do until then, ask  your doctor or other care provider.   metoprolol succinate 100 MG 24 hr tablet Commonly known as: TOPROL-XL Take 100 mg by mouth every morning. Take with or immediately following a meal.   ondansetron 4 MG tablet Commonly known as: ZOFRAN Take 1 tablet (4 mg total) by mouth every 6 (six) hours as needed for nausea.   REFRESH RELIEVA PF OP Place 1 drop into both eyes 4 (four) times daily.   VITAMIN B12 TR PO Take 1 tablet by mouth daily. OTC gummy   vitamin C 500 MG tablet Commonly known as: ASCORBIC ACID Take 500 mg by mouth daily.   VITAMIN D3 PO Take 1 tablet by mouth See admin instructions. Chew 1 gummy by mouth twice weekly - Tuesday and Thursday   VITAMIN E PO Take 1 tablet by mouth daily. OTC gummy         Discharge Exam: Filed Weights   05/31/21 0200 06/01/21 0549 06/02/21 0428  Weight: 57.4 kg 56.8 kg 56.3 kg   Vitals:   06/02/21 0812 06/02/21 0833  BP:  (!) 163/65  Pulse: 76 80  Resp:  20  Temp: 98.5 F (36.9 C) 98.5 F (36.9 C)  SpO2:  95%   Constitutional: Thin elderly Caucasian female who is deaf but is calm and comfortable Lungs: CTAB with no Wheezing/Rales/Rhonchi; Unlabored breathing  Cardiovascular: RRR, no murmurs / rubs / gallops. S1 and S2 auscultated. No extremity edema.  Abdomen: Soft, non-tender, non-distended. Bowel sounds positive.  GU: Deferred. Musculoskeletal:  No clubbing / cyanosis of digits/nails. No joint deformity upper and lower extremities.  Condition at discharge: stable  The results of significant diagnostics from this hospitalization (including imaging, microbiology, ancillary and laboratory) are listed below for reference.   Imaging Studies: CARDIAC CATHETERIZATION  Result Date: 8/56/3149   LV end diastolic pressure is normal. Normal coronary anatomy Normal LV filling pressures PCWP 7 mm Hg mean Normal right heart pressures PAP mean 17 mm Hg Normal cardiac output. Index 3.03. Plan: per rounding team.   DG Chest Port 1 View  Result Date: 05/31/2021 CLINICAL DATA:  Chest pain. EXAM: PORTABLE CHEST 1 VIEW COMPARISON:  May 02, 2010 FINDINGS: There is moderate to marked severity enlargement of the cardiac silhouette which is increased in severity when compared to the prior study. Marked severity curvilinear cardiac related calcification is seen overlying the medial aspect of the left lung base. Mildly decreased lung volumes are noted. Both lungs are clear. Multilevel degenerative changes are seen throughout the thoracic spine. IMPRESSION: Cardiomegaly, as described above, without acute cardiopulmonary disease. Electronically Signed   By: Virgina Norfolk M.D.   On: 05/31/2021 02:54    Microbiology: Results for orders placed or performed during the hospital encounter of 05/31/21  Resp Panel by RT-PCR (Flu A&B, Covid) Nasopharyngeal Swab     Status: None   Collection Time: 05/31/21  4:24 AM   Specimen: Nasopharyngeal Swab; Nasopharyngeal(NP) swabs in vial transport medium  Result Value Ref Range Status   SARS Coronavirus 2 by RT PCR NEGATIVE NEGATIVE Final    Comment: (NOTE) SARS-CoV-2 target nucleic acids are NOT DETECTED.  The SARS-CoV-2 RNA is generally detectable in upper respiratory specimens during the acute phase of infection. The lowest concentration of SARS-CoV-2 viral copies this assay can detect is 138 copies/mL. A negative  result does not preclude SARS-Cov-2 infection and should not be used as the sole basis for treatment or other patient management decisions. A negative result may occur with  improper specimen collection/handling, submission  of specimen other than nasopharyngeal swab, presence of viral mutation(s) within the areas targeted by this assay, and inadequate number of viral copies(<138 copies/mL). A negative result must be combined with clinical observations, patient history, and epidemiological information. The expected result is Negative.  Fact Sheet for Patients:  EntrepreneurPulse.com.au  Fact Sheet for Healthcare Providers:  IncredibleEmployment.be  This test is no t yet approved or cleared by the [place] FDA and  has been authorized for detection and/or diagnosis of SARS-CoV-2 by FDA under an Emergency Use Authorization (EUA). This EUA will remain  in effect (meaning this test can be used) for the duration of the COVID-19 declaration under Section 564(b)(1) of the Act, 21 U.S.C.section 360bbb-3(b)(1), unless the authorization is terminated  or revoked sooner.       Influenza A by PCR NEGATIVE NEGATIVE Final   Influenza B by PCR NEGATIVE NEGATIVE Final    Comment: (NOTE) The Xpert Xpress SARS-CoV-2/FLU/RSV plus assay is intended as an aid in the diagnosis of influenza from Nasopharyngeal swab specimens and should not be used as a sole basis for treatment. Nasal washings and aspirates are unacceptable for Xpert Xpress SARS-CoV-2/FLU/RSV testing.  Fact Sheet for Patients: EntrepreneurPulse.com.au  Fact Sheet for Healthcare Providers: IncredibleEmployment.be  This test is not yet approved or cleared by the [place] FDA and has been authorized for detection and/or diagnosis of SARS-CoV-2 by FDA under an Emergency Use Authorization (EUA). This EUA will remain in effect (meaning this test can be used)  for the duration of the COVID-19 declaration under Section 564(b)(1) of the Act, 21 U.S.C. section 360bbb-3(b)(1), unless the authorization is terminated or revoked.  Performed at [facility], [place] [address] [place], [place] [address]     Labs: CBC: Recent Labs  Lab 05/29/21 [phone] 05/31/21 0226 05/31/21 0844 06/01/21 0346 06/01/21 1341 06/01/21 1342 06/01/21 1351 06/01/21 1352 06/02/21 0421  WBC 7.3 8.0  --  6.9  --   --   --   --  8.9  NEUTROABS  --  5.8  --   --   --   --   --   --  6.7  HGB 12.6 10.9*   < > 10.4* 10.2* 9.2* 10.2* 10.2* 10.5*  HCT 38.3 34.6*   < > 31.9* 30.0* 27.0* 30.0* 30.0* 32.9*  MCV 95 98.6  --  97.9  --   --   --   --  97.9  PLT 189 161  --  146*  --   --   --   --  148*   < > = values in this interval not displayed.   Basic Metabolic Panel: Recent Labs  Lab 05/29/21 0754 05/29/21 0754 05/31/21 0226 05/31/21 0844 06/01/21 0346 06/01/21 1341 06/01/21 1342 06/01/21 1351 06/01/21 1352 06/02/21 0421  NA 143   < > 139  --  135 139 144 133* 134* 138  K 3.5  --  3.1*  --  4.5 4.2 3.6 4.1 4.2 4.6  CL 94*  --  95*  --  97*  --   --   --   --  103  CO2 32*  --  32  --  32  --   --   --   --  27  GLUCOSE 154*  --  177*  --  141*  --   --   --   --  116*  BUN 31*  --  32*  --  32*  --   --   --   --  26*  CREATININE 1.09*  --  1.18*  --  1.35*  --   --   --   --  1.16*  CALCIUM 9.9  --  10.2  --  9.3  --   --   --   --  9.3  MG  --   --   --  1.8 2.1  --   --   --   --  1.9  PHOS  --   --   --   --   --   --   --   --   --  3.9   < > = values in this interval not displayed.   Liver Function Tests: Recent Labs  Lab 06/02/21 0421  AST 20  ALT 15  ALKPHOS 18*  BILITOT 0.4  PROT 5.8*  ALBUMIN 3.0*   CBG: No results for input(s): GLUCAP in the last 168 hours.  Discharge time spent: greater than 30 minutes.  Signed: Raiford Noble, DO [facility] 06/02/2021

## 2021-06-02 NOTE — Assessment & Plan Note (Signed)
-  Moderate to severe by echo 04/2021 -Cardiology feels that his severe tricuspid regurg and patient also has severe mitral annular calcification with severe mitral regurg and moderate to severe tricuspid regurg -Cardiology is planning for right and left heart cath which was done today and they are arranging for TEE for MitraClip purposes as an outpatient given that it cannot be done today -Cardiology recommends continuing aspirin, statin, beta-blocker

## 2021-06-02 NOTE — Progress Notes (Signed)
Progress Note  Patient Name: Maria Collier Date of Encounter: 06/02/2021  [facility] Cardiologist: Fransico Him, MD   Subjective   Cor angio + RHC without CAD and V waves 20mmHg.  CO 4.7 L/min    Inpatient Medications    Scheduled Meds:  amiodarone  400 mg Oral BID   apixaban  2.5 mg Oral BID   aspirin EC  81 mg Oral Daily   atorvastatin  20 mg Oral Daily   metoprolol succinate  100 mg Oral Daily   sodium chloride flush  3 mL Intravenous Q12H   sodium chloride flush  3 mL Intravenous Q12H   sodium chloride flush  3 mL Intravenous Q12H   Continuous Infusions:  sodium chloride     sodium chloride     PRN Meds: sodium chloride, acetaminophen **OR** acetaminophen, albuterol, ondansetron **OR** ondansetron (ZOFRAN) IV, sodium chloride flush   Vital Signs    Vitals:   06/01/21 2116 06/02/21 0040 06/02/21 0116 06/02/21 0428  BP: 127/66 (!) 144/62 (!) 130/53 137/66  Pulse:  72  76  Resp: 20 (!) 25 19 20   Temp:  98.9 F (37.2 C)  98.8 F (37.1 C)  TempSrc:  Oral  Oral  SpO2:  97% 97% 93%  Weight:    56.3 kg  Height:        Intake/Output Summary (Last 24 hours) at 06/02/2021 0805 Last data filed at 06/01/2021 1620 Gross per 24 hour  Intake 122.06 ml  Output --  Net 122.06 ml    Last 3 Weights 06/02/2021 06/01/2021 05/31/2021  Weight (lbs) 124 lb 1.9 oz 125 lb 3.5 oz 126 lb 9.6 oz  Weight (kg) 56.3 kg 56.8 kg 57.425 kg      Telemetry    NSR - Personally Reviewed  ECG    N/A  Physical Exam   GEN: No acute distress.   Neck: No JVD Cardiac: GEZ,6/6 systolic murmur, without rubs, or gallops.  Respiratory: Clear to auscultation bilaterally. GI: Soft, nontender, non-distended  MS: No edema; No deformity. Neuro:  Nonfocal  Psych: Normal affect   Labs    High Sensitivity Troponin:   Recent Labs  Lab 05/31/21 0226 05/31/21 0400  TROPONINIHS 29* 39*      Chemistry Recent Labs  Lab 05/31/21 0226 05/31/21 0844 06/01/21 0346 06/01/21 1341  06/01/21 1351 06/01/21 1352 06/02/21 0421  NA 139  --  135   < > 133* 134* 138  K 3.1*  --  4.5   < > 4.1 4.2 4.6  CL 95*  --  97*  --   --   --  103  CO2 32  --  32  --   --   --  27  GLUCOSE 177*  --  141*  --   --   --  116*  BUN 32*  --  32*  --   --   --  26*  CREATININE 1.18*  --  1.35*  --   --   --  1.16*  CALCIUM 10.2  --  9.3  --   --   --  9.3  MG  --  1.8 2.1  --   --   --  1.9  PROT  --   --   --   --   --   --  5.8*  ALBUMIN  --   --   --   --   --   --  3.0*  AST  --   --   --   --   --   --  20  ALT  --   --   --   --   --   --  15  ALKPHOS  --   --   --   --   --   --  18*  BILITOT  --   --   --   --   --   --  0.4  GFRNONAA 45*  --  39*  --   --   --  46*  ANIONGAP 12  --  6  --   --   --  8   < > = values in this interval not displayed.     Hematology Recent Labs  Lab 05/31/21 0226 05/31/21 0844 06/01/21 0346 06/01/21 1341 06/01/21 1351 06/01/21 1352 06/02/21 0421  WBC 8.0  --  6.9  --   --   --  8.9  RBC 3.51*  --  3.26*  --   --   --  3.36*  HGB 10.9*   < > 10.4*   < > 10.2* 10.2* 10.5*  HCT 34.6*   < > 31.9*   < > 30.0* 30.0* 32.9*  MCV 98.6  --  97.9  --   --   --  97.9  MCH 31.1  --  31.9  --   --   --  31.3  MCHC 31.5  --  32.6  --   --   --  31.9  RDW 12.8  --  13.0  --   --   --  13.0  PLT 161  --  146*  --   --   --  148*   < > = values in this interval not displayed.    Thyroid  Recent Labs  Lab 05/31/21 0844  TSH 1.540     BNP Recent Labs  Lab 05/31/21 0226  BNP 597.6*      Radiology    CARDIAC CATHETERIZATION  Result Date: 1/60/1093   LV end diastolic pressure is normal. Normal coronary anatomy Normal LV filling pressures PCWP 7 mm Hg mean Normal right heart pressures PAP mean 17 mm Hg Normal cardiac output. Index 3.03. Plan: per rounding team.    Cardiac Studies   Echo 04/24/21 1. Left ventricular ejection fraction, by estimation, is 60 to 65%. The  left ventricle has normal function. The left ventricle has no  regional  wall motion abnormalities. There is mild left ventricular hypertrophy.  Left ventricular diastolic parameters  are consistent with Grade III diastolic dysfunction (restrictive).  Elevated left atrial pressure.   2. Right ventricular systolic function is normal. The right ventricular  size is mildly enlarged. There is moderately elevated pulmonary artery  systolic pressure. The estimated right ventricular systolic pressure is  23.5 mmHg.   3. Left atrial size was severely dilated.   4. Right atrial size was mildly dilated.   5. The mitral valve is degenerative. Severe mitral valve regurgitation.  Severe mitral annular calcification.   6. Pulmonic valve regurgitation is moderate.   7. The tricuspid valve is abnormal. Tricuspid valve regurgitation is  moderate to severe.   8. The aortic valve is tricuspid. Aortic valve regurgitation is trivial.  Aortic valve sclerosis is present, with no evidence of aortic valve  stenosis.   9. The inferior vena cava is normal in size with greater than 50%  respiratory variability, suggesting right atrial pressure of 3 mmHg.   Patient Profile     88 y.o. 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 Joseph Berkshire.  Assessment & Plan    New Onset Atrial Fibrillation with RVR Remains in NSR.  Cont Toprol XL and amiodarone 400 bid x 2 weeks then 200 bid x 2 weeks then 200qd x 2 weeks then d/c.  Cont Eliquis.  D/C today.  2. Severe Mitral Regurgitation 3. Severe Tricuspid Regurgitation 4. Mitral Annular Calcifications -Structural reader not available today for TEE for Mitraclip today; has outpatient TEE next week, d/c today.  5. HTN - BP stable now   6. AKI Resolved.  7. DM - Hgb A1c 6.8 - Per  primary team     For questions or updates, please contact [place] Please consult [email or web address] for contact info under        Signed, Early Osmond, MD  06/02/2021, 8:05 AM

## 2021-06-02 NOTE — H&P (View-Only) (Signed)
Progress Note  Patient Name: Maria Collier Date of Encounter: 06/02/2021  [facility] Cardiologist: Fransico Him, MD   Subjective   Cor angio + RHC without CAD and V waves 41mmHg.  CO 4.7 L/min    Inpatient Medications    Scheduled Meds:  amiodarone  400 mg Oral BID   apixaban  2.5 mg Oral BID   aspirin EC  81 mg Oral Daily   atorvastatin  20 mg Oral Daily   metoprolol succinate  100 mg Oral Daily   sodium chloride flush  3 mL Intravenous Q12H   sodium chloride flush  3 mL Intravenous Q12H   sodium chloride flush  3 mL Intravenous Q12H   Continuous Infusions:  sodium chloride     sodium chloride     PRN Meds: sodium chloride, acetaminophen **OR** acetaminophen, albuterol, ondansetron **OR** ondansetron (ZOFRAN) IV, sodium chloride flush   Vital Signs    Vitals:   06/01/21 2116 06/02/21 0040 06/02/21 0116 06/02/21 0428  BP: 127/66 (!) 144/62 (!) 130/53 137/66  Pulse:  72  76  Resp: 20 (!) 25 19 20   Temp:  98.9 F (37.2 C)  98.8 F (37.1 C)  TempSrc:  Oral  Oral  SpO2:  97% 97% 93%  Weight:    56.3 kg  Height:        Intake/Output Summary (Last 24 hours) at 06/02/2021 0805 Last data filed at 06/01/2021 1620 Gross per 24 hour  Intake 122.06 ml  Output --  Net 122.06 ml    Last 3 Weights 06/02/2021 06/01/2021 05/31/2021  Weight (lbs) 124 lb 1.9 oz 125 lb 3.5 oz 126 lb 9.6 oz  Weight (kg) 56.3 kg 56.8 kg 57.425 kg      Telemetry    NSR - Personally Reviewed  ECG    N/A  Physical Exam   GEN: No acute distress.   Neck: No JVD Cardiac: NUU,7/2 systolic murmur, without rubs, or gallops.  Respiratory: Clear to auscultation bilaterally. GI: Soft, nontender, non-distended  MS: No edema; No deformity. Neuro:  Nonfocal  Psych: Normal affect   Labs    High Sensitivity Troponin:   Recent Labs  Lab 05/31/21 0226 05/31/21 0400  TROPONINIHS 29* 39*      Chemistry Recent Labs  Lab 05/31/21 0226 05/31/21 0844 06/01/21 0346 06/01/21 1341  06/01/21 1351 06/01/21 1352 06/02/21 0421  NA 139  --  135   < > 133* 134* 138  K 3.1*  --  4.5   < > 4.1 4.2 4.6  CL 95*  --  97*  --   --   --  103  CO2 32  --  32  --   --   --  27  GLUCOSE 177*  --  141*  --   --   --  116*  BUN 32*  --  32*  --   --   --  26*  CREATININE 1.18*  --  1.35*  --   --   --  1.16*  CALCIUM 10.2  --  9.3  --   --   --  9.3  MG  --  1.8 2.1  --   --   --  1.9  PROT  --   --   --   --   --   --  5.8*  ALBUMIN  --   --   --   --   --   --  3.0*  AST  --   --   --   --   --   --  20  ALT  --   --   --   --   --   --  15  ALKPHOS  --   --   --   --   --   --  18*  BILITOT  --   --   --   --   --   --  0.4  GFRNONAA 45*  --  39*  --   --   --  46*  ANIONGAP 12  --  6  --   --   --  8   < > = values in this interval not displayed.     Hematology Recent Labs  Lab 05/31/21 0226 05/31/21 0844 06/01/21 0346 06/01/21 1341 06/01/21 1351 06/01/21 1352 06/02/21 0421  WBC 8.0  --  6.9  --   --   --  8.9  RBC 3.51*  --  3.26*  --   --   --  3.36*  HGB 10.9*   < > 10.4*   < > 10.2* 10.2* 10.5*  HCT 34.6*   < > 31.9*   < > 30.0* 30.0* 32.9*  MCV 98.6  --  97.9  --   --   --  97.9  MCH 31.1  --  31.9  --   --   --  31.3  MCHC 31.5  --  32.6  --   --   --  31.9  RDW 12.8  --  13.0  --   --   --  13.0  PLT 161  --  146*  --   --   --  148*   < > = values in this interval not displayed.    Thyroid  Recent Labs  Lab 05/31/21 0844  TSH 1.540     BNP Recent Labs  Lab 05/31/21 0226  BNP 597.6*      Radiology    CARDIAC CATHETERIZATION  Result Date: 12/22/7827   LV end diastolic pressure is normal. Normal coronary anatomy Normal LV filling pressures PCWP 7 mm Hg mean Normal right heart pressures PAP mean 17 mm Hg Normal cardiac output. Index 3.03. Plan: per rounding team.    Cardiac Studies   Echo 04/24/21 1. Left ventricular ejection fraction, by estimation, is 60 to 65%. The  left ventricle has normal function. The left ventricle has no  regional  wall motion abnormalities. There is mild left ventricular hypertrophy.  Left ventricular diastolic parameters  are consistent with Grade III diastolic dysfunction (restrictive).  Elevated left atrial pressure.   2. Right ventricular systolic function is normal. The right ventricular  size is mildly enlarged. There is moderately elevated pulmonary artery  systolic pressure. The estimated right ventricular systolic pressure is  56.2 mmHg.   3. Left atrial size was severely dilated.   4. Right atrial size was mildly dilated.   5. The mitral valve is degenerative. Severe mitral valve regurgitation.  Severe mitral annular calcification.   6. Pulmonic valve regurgitation is moderate.   7. The tricuspid valve is abnormal. Tricuspid valve regurgitation is  moderate to severe.   8. The aortic valve is tricuspid. Aortic valve regurgitation is trivial.  Aortic valve sclerosis is present, with no evidence of aortic valve  stenosis.   9. The inferior vena cava is normal in size with greater than 50%  respiratory variability, suggesting right atrial pressure of 3 mmHg.   Patient Profile     88 y.o. 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 Joseph Berkshire.  Assessment & Plan    New Onset Atrial Fibrillation with RVR Remains in NSR.  Cont Toprol XL and amiodarone 400 bid x 2 weeks then 200 bid x 2 weeks then 200qd x 2 weeks then d/c.  Cont Eliquis.  D/C today.  2. Severe Mitral Regurgitation 3. Severe Tricuspid Regurgitation 4. Mitral Annular Calcifications -Structural reader not available today for TEE for Mitraclip today; has outpatient TEE next week, d/c today.  5. HTN - BP stable now   6. AKI Resolved.  7. DM - Hgb A1c 6.8 - Per  primary team     For questions or updates, please contact [place] Please consult [email or web address] for contact info under        Signed, Early Osmond, MD  06/02/2021, 8:05 AM

## 2021-06-05 ENCOUNTER — Other Ambulatory Visit: Payer: Self-pay

## 2021-06-05 DIAGNOSIS — I340 Nonrheumatic mitral (valve) insufficiency: Secondary | ICD-10-CM

## 2021-06-06 ENCOUNTER — Ambulatory Visit (HOSPITAL_COMMUNITY)
Admission: RE | Admit: 2021-06-06 | Discharge: 2021-06-06 | Disposition: A | Discharge status: Home / Self Care | Payer: Medicare HMO | Attending: Cardiovascular Disease | Admitting: Cardiovascular Disease

## 2021-06-06 ENCOUNTER — Ambulatory Visit (HOSPITAL_COMMUNITY): Payer: Medicare HMO | Admitting: Certified Registered"

## 2021-06-06 ENCOUNTER — Encounter (HOSPITAL_COMMUNITY)
Admission: RE | Disposition: A | Discharge status: Home / Self Care | Payer: Medicare HMO | Source: Home / Self Care | Attending: Cardiovascular Disease

## 2021-06-06 ENCOUNTER — Other Ambulatory Visit: Payer: Self-pay

## 2021-06-06 ENCOUNTER — Ambulatory Visit (HOSPITAL_BASED_OUTPATIENT_CLINIC_OR_DEPARTMENT_OTHER): Payer: Medicare HMO | Admitting: Certified Registered"

## 2021-06-06 ENCOUNTER — Ambulatory Visit (HOSPITAL_BASED_OUTPATIENT_CLINIC_OR_DEPARTMENT_OTHER)
Admission: RE | Admit: 2021-06-06 | Discharge: 2021-06-06 | Disposition: A | Discharge status: Home / Self Care | Payer: Medicare HMO | Source: Ambulatory Visit | Attending: Internal Medicine | Admitting: Internal Medicine

## 2021-06-06 ENCOUNTER — Encounter (HOSPITAL_COMMUNITY): Payer: Self-pay | Admitting: Cardiovascular Disease

## 2021-06-06 DIAGNOSIS — H9190 Unspecified hearing loss, unspecified ear: Secondary | ICD-10-CM | POA: Insufficient documentation

## 2021-06-06 DIAGNOSIS — I081 Rheumatic disorders of both mitral and tricuspid valves: Secondary | ICD-10-CM | POA: Diagnosis not present

## 2021-06-06 DIAGNOSIS — Z7901 Long term (current) use of anticoagulants: Secondary | ICD-10-CM | POA: Diagnosis not present

## 2021-06-06 DIAGNOSIS — I509 Heart failure, unspecified: Secondary | ICD-10-CM | POA: Insufficient documentation

## 2021-06-06 DIAGNOSIS — I083 Combined rheumatic disorders of mitral, aortic and tricuspid valves: Secondary | ICD-10-CM | POA: Diagnosis not present

## 2021-06-06 DIAGNOSIS — I340 Nonrheumatic mitral (valve) insufficiency: Secondary | ICD-10-CM

## 2021-06-06 DIAGNOSIS — N179 Acute kidney failure, unspecified: Secondary | ICD-10-CM | POA: Insufficient documentation

## 2021-06-06 DIAGNOSIS — E785 Hyperlipidemia, unspecified: Secondary | ICD-10-CM | POA: Diagnosis not present

## 2021-06-06 DIAGNOSIS — E119 Type 2 diabetes mellitus without complications: Secondary | ICD-10-CM | POA: Insufficient documentation

## 2021-06-06 DIAGNOSIS — I272 Pulmonary hypertension, unspecified: Secondary | ICD-10-CM | POA: Insufficient documentation

## 2021-06-06 DIAGNOSIS — I110 Hypertensive heart disease with heart failure: Secondary | ICD-10-CM | POA: Diagnosis not present

## 2021-06-06 DIAGNOSIS — I371 Nonrheumatic pulmonary valve insufficiency: Secondary | ICD-10-CM | POA: Diagnosis not present

## 2021-06-06 DIAGNOSIS — Z7984 Long term (current) use of oral hypoglycemic drugs: Secondary | ICD-10-CM | POA: Diagnosis not present

## 2021-06-06 HISTORY — PX: TEE WITHOUT CARDIOVERSION: SHX5443

## 2021-06-06 LAB — ECHO TEE
Area-P 1/2: 4.01 cm2
MV M vel: 5.59 m/s
MV Peak grad: 125 mmHg
Radius: 0.81 cm

## 2021-06-06 LAB — GLUCOSE, CAPILLARY
Glucose-Capillary: 115 mg/dL — ABNORMAL HIGH (ref 70–99)
Glucose-Capillary: 132 mg/dL — ABNORMAL HIGH (ref 70–99)

## 2021-06-06 SURGERY — ECHOCARDIOGRAM, TRANSESOPHAGEAL
Anesthesia: Monitor Anesthesia Care

## 2021-06-06 SURGERY — RIGHT/LEFT HEART CATH AND CORONARY ANGIOGRAPHY
Anesthesia: LOCAL

## 2021-06-06 MED ORDER — SODIUM CHLORIDE 0.9 % IV SOLN: INTRAVENOUS | Status: DC

## 2021-06-06 MED ORDER — PROPOFOL 500 MG/50ML IV EMUL
INTRAVENOUS | Status: DC | PRN
  Administered 2021-06-06: 200 ug/kg/min via INTRAVENOUS

## 2021-06-06 MED ORDER — PROPOFOL 10 MG/ML IV BOLUS
INTRAVENOUS | Status: DC | PRN
  Administered 2021-06-06: 25 mg via INTRAVENOUS
  Administered 2021-06-06: 20 mg via INTRAVENOUS

## 2021-06-06 MED ORDER — LIDOCAINE 2% (20 MG/ML) 5 ML SYRINGE
INTRAMUSCULAR | Status: DC | PRN
  Administered 2021-06-06: 60 mg via INTRAVENOUS

## 2021-06-06 NOTE — Anesthesia Postprocedure Evaluation (Signed)
Anesthesia Post Note  Patient: Maria Collier  Procedure(s) Performed: TRANSESOPHAGEAL ECHOCARDIOGRAM (TEE)     Patient location during evaluation: PACU Anesthesia Type: MAC Level of consciousness: awake and alert and oriented Pain management: pain level controlled Vital Signs Assessment: post-procedure vital signs reviewed and stable Respiratory status: spontaneous breathing, nonlabored ventilation and respiratory function stable Cardiovascular status: stable and blood pressure returned to baseline Postop Assessment: no apparent nausea or vomiting Anesthetic complications: no   No notable events documented.  Last Vitals:  Vitals:   06/06/21 0930 06/06/21 0945  BP: 118/79 (!) 140/53  Pulse: 65 60  Resp: (!) 21 18  Temp:  36.4 C  SpO2: 100% 98%    Last Pain:  Vitals:   06/06/21 0945  TempSrc:   PainSc: 0-No pain                 Mitsugi Schrader A.

## 2021-06-06 NOTE — Interval H&P Note (Signed)
History and Physical Interval Note:  06/06/2021 8:29 AM  Maria Collier  has presented today for surgery, with the diagnosis of MITRAL REGURGITATION.  The various methods of treatment have been discussed with the patient and family. After consideration of risks, benefits and other options for treatment, the patient has consented to  Procedure(s): TRANSESOPHAGEAL ECHOCARDIOGRAM (TEE) (N/A) as a surgical intervention.  The patient's history has been reviewed, patient examined, no change in status, stable for surgery.  I have reviewed the patient's chart and labs.  Questions were answered to the patient's satisfaction.     Jalil Lorusso

## 2021-06-06 NOTE — Anesthesia Procedure Notes (Signed)
Procedure Name: MAC Date/Time: 06/06/2021 9:01 AM Performed by: Imagene Riches, CRNA Pre-anesthesia Checklist: Patient identified, Emergency Drugs available, Suction available, Patient being monitored and Timeout performed Patient Re-evaluated:Patient Re-evaluated prior to induction Oxygen Delivery Method: Nasal cannula

## 2021-06-06 NOTE — Progress Notes (Signed)
°  Echocardiogram Echocardiogram Transesophageal has been performed.  Maria Collier 06/06/2021, 9:36 AM

## 2021-06-06 NOTE — Transfer of Care (Signed)
Immediate Anesthesia Transfer of Care Note  Patient: Maria Collier  Procedure(s) Performed: TRANSESOPHAGEAL ECHOCARDIOGRAM (TEE)  Patient Location: PACU  Anesthesia Type:MAC  Level of Consciousness: drowsy  Airway & Oxygen Therapy: Patient Spontanous Breathing and Patient connected to nasal cannula oxygen  Post-op Assessment: Report given to RN and Post -op Vital signs reviewed and stable  Post vital signs: Reviewed and stable  Last Vitals:  Vitals Value Taken Time  BP 97/43 06/06/21 0921  Temp 36.6 C 06/06/21 0915  Pulse 63 06/06/21 0921  Resp 22 06/06/21 0921  SpO2 97 % 06/06/21 0921  Vitals shown include unvalidated device data.  Last Pain:  Vitals:   06/06/21 0915  TempSrc:   PainSc: Asleep         Complications: No notable events documented.

## 2021-06-06 NOTE — Op Note (Addendum)
INDICATIONS: Mitral insufficiency  PROCEDURE:   Informed consent was obtained prior to the procedure. The risks, benefits and alternatives for the procedure were discussed and the patient comprehended these risks.  Risks include, but are not limited to, cough, sore throat, vomiting, nausea, somnolence, esophageal and stomach trauma or perforation, bleeding, low blood pressure, aspiration, pneumonia, infection, trauma to the teeth and death.    After a procedural time-out, the oropharynx was anesthetized with 20% benzocaine spray.   During this procedure the patient was administered IV propofol by Dr. Royce Macadamia, Anesthesiology.  The transesophageal probe was inserted in the esophagus and stomach without difficulty and multiple views were obtained.  The patient was kept under observation until the patient left the procedure room.  The patient left the procedure room in stable condition.   Agitated microbubble saline contrast was not administered.  COMPLICATIONS:    There were no immediate complications.  FINDINGS:  Severe MR due to flail P2 scallop of posterior mitral leaflet, ruptured chordae. 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 Severe PAH. Normal LV EF.  The corresponding segment of the anterior mitral leaflet, across from the flail posterior scallop is moderately thickened, but does not appear to be prohibitive for TEER of the MV.  RECOMMENDATIONS:     Proceed with MitraClip workup.  Time Spent Directly with the Patient:  35 minutes   Maria Collier 06/06/2021, 9:12 AM

## 2021-06-06 NOTE — Anesthesia Preprocedure Evaluation (Addendum)
Anesthesia Evaluation  Patient identified by MRN, date of birth, ID band Patient awake    Reviewed: Allergy & Precautions, NPO status , Patient's Chart, lab work & pertinent test results  Airway Mallampati: II  TM Distance: >3 FB Neck ROM: Full    Dental  (+) Dental Advisory Given   Pulmonary shortness of breath, with exertion and at rest, pneumonia, resolved,    Pulmonary exam normal breath sounds clear to auscultation       Cardiovascular hypertension, Pt. on medications and Pt. on home beta blockers pulmonary hypertension+CHF  + dysrhythmias Atrial Fibrillation + Valvular Problems/Murmurs MR  Rhythm:Irregular Rate:Tachycardia  Pulmonary HTN  Severe MR A Fib with RVR recent onset, on Pacerone  Echo 04/24/21 1. Left ventricular ejection fraction, by estimation, is 60 to 65%. The left ventricle has normal function. The left ventricle has no regional wall motion abnormalities. There is mild left ventricular hypertrophy.  Left ventricular diastolic parameters  are consistent with Grade III diastolic dysfunction (restrictive). Elevated left atrial pressure.  2. Right ventricular systolic function is normal. The right ventricular size is mildly enlarged. There is moderately elevated pulmonary artery systolic pressure. The estimated right ventricular systolic pressure is  31.4 mmHg.  3. Left atrial size was severely dilated.  4. Right atrial size was mildly dilated.  5. The mitral valve is degenerative. Severe mitral valve regurgitation. Severe mitral annular calcification.  6. Pulmonic valve regurgitation is moderate.  7. The tricuspid valve is abnormal. Tricuspid valve regurgitation is moderate to severe.  8. The aortic valve is tricuspid. Aortic valve regurgitation is trivial. Aortic valve sclerosis is present, with no evidence of aortic valve stenosis.  9. The inferior vena cava is normal in size with greater than 50%  respiratory variability, suggesting right atrial pressure of 3 mmHg.   EKG 05/31/21 Atrial fibrillation with RVR 137/min, RBBB with LAFB  Cardiac Cath 9/70   LV end diastolic pressure is normal.  1. Normal coronary anatomy 2. Normal LV filling pressures PCWP 7 mm Hg mean 3. Normal right heart pressures PAP mean 17 mm Hg 4. Normal cardiac output. Index 3.03.      Neuro/Psych  Headaches, Deafness since meningitis 1939 negative psych ROS   GI/Hepatic Neg liver ROS, GERD  Medicated and Controlled,  Endo/Other  diabetes, Well Controlled, Type 2, Oral Hypoglycemic AgentsHx/o DCIS left breast S/P lumpectomy Hyperlipidemia  Renal/GU Renal InsufficiencyRenal disease  negative genitourinary   Musculoskeletal negative musculoskeletal ROS (+)   Abdominal   Peds  Hematology  (+) Blood dyscrasia, anemia , Eliquis therapy- last dose 2/27   Anesthesia Other Findings   Reproductive/Obstetrics                            Anesthesia Physical Anesthesia Plan  ASA: 3  Anesthesia Plan: MAC   Post-op Pain Management: Minimal or no pain anticipated   Induction: Intravenous  PONV Risk Score and Plan: 2 and Ondansetron and Treatment may vary due to age or medical condition  Airway Management Planned: Simple Face Mask and Natural Airway  Additional Equipment:   Intra-op Plan:   Post-operative Plan:   Informed Consent: I have reviewed the patients History and Physical, chart, labs and discussed the procedure including the risks, benefits and alternatives for the proposed anesthesia with the patient or authorized representative who has indicated his/her understanding and acceptance.     Dental advisory given  Plan Discussed with: Anesthesiologist and CRNA  Anesthesia Plan Comments:  Anesthesia Quick Evaluation

## 2021-06-07 ENCOUNTER — Encounter (HOSPITAL_COMMUNITY): Payer: Self-pay | Admitting: Cardiovascular Disease

## 2021-06-12 ENCOUNTER — Telehealth: Payer: Self-pay | Admitting: Cardiology

## 2021-06-12 NOTE — Telephone Encounter (Signed)
Pt c/o Shortness Of Breath: STAT if SOB developed within the last 24 hours or pt is noticeably SOB on the phone ? ?1. Are you currently SOB (can you hear that pt is SOB on the phone)? Yes ? ?2. How long have you been experiencing SOB? Since 02/28 when she came home from the hospital  ? ?3. Are you SOB when sitting or when up moving around? Only when moving around and sleeping  ? ?4. Are you currently experiencing any other symptoms?  No  ? ?Spoke with pt through sign language interpreter. States she has been having issues with SOB since leaving the hospital. She is unable to sleep due to it occurring when she is laying down or moving around. Requesting a detailed VM be left if she does not answer so she can see what is said. Please advise.   ?

## 2021-06-12 NOTE — Telephone Encounter (Signed)
Spoke with the patient through a sign language interpretor. She reports that she has been having a lot of shortness of breath since her TEE last week. She reports that her breathing is more labored when she is moving around. She also reports that she cannot lay flat due to difficulty breathing. She states that she is unable to sleep through the night. She denies any swelling or weight gain. She is taking all medications prescribed. She is currently being worked up for a mitraclip. Appt with Dr. Kipp Brood 3/17.  ?

## 2021-06-12 NOTE — Telephone Encounter (Signed)
Left message for the patient to call back.

## 2021-06-13 ENCOUNTER — Telehealth: Payer: Self-pay

## 2021-06-13 DIAGNOSIS — I340 Nonrheumatic mitral (valve) insufficiency: Secondary | ICD-10-CM

## 2021-06-13 DIAGNOSIS — I071 Rheumatic tricuspid insufficiency: Secondary | ICD-10-CM

## 2021-06-13 DIAGNOSIS — R079 Chest pain, unspecified: Secondary | ICD-10-CM

## 2021-06-13 NOTE — Telephone Encounter (Signed)
Left message to call back  

## 2021-06-13 NOTE — Telephone Encounter (Signed)
The patient called [facility] complaining of CP and SOB and was scheduled to see Richardson Dopp tomorrow morning.  ?cCT scheduled first available time 06/23/2021 at 7:45AM.  ?The patient communicates with ASL and the  ?Will arrange instruction letter so it is available  ? ?Attempted to call the patient's daughter, [name], and then for [facility].  ? ?Left messages they will be called tomorrow for an update as they are unaware of CT and follow-up appointment with Dr. Kipp Brood.  ? ? ?

## 2021-06-13 NOTE — Progress Notes (Addendum)
Office Visit    Patient Name: Maria Collier Date of Encounter: 06/13/2021  Primary Care Provider:  Kelton Pillar, MD Primary Cardiologist:  Fransico Him, MD  Chief Complaint    CP and SOB following recent TEE  History of Present Illness    Maria Collier is a 88 y.o. 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 Radford Pax and established care in 2016. Echo performed 04/24/21 normal EF 60-65%, mild LVH, elevated pulmonary artery pressures, grade III DD, degenerative mitral,pulmonic,and tricuspid valve regurgitation.    She was referred to structural heart clinic and seen by Dr.Thukkani for valvular disease and possible MitraClip.  Outpatient TEE and R/LHC were rescheduled for 2/28. On 2/22  patient presented to ED for c/o dizziness and chest pain.  In the ED developed A-fib with RVR with rate 140s to 160s treated with Cardizem that was transitioned to amiodarone and converted to NSR. She was admitted to progressive care unit and L/RHC completed with normal coronaries and normal heart pressures.  She was discharged on amiodarone, eliquis, and Toprol-XL.  Patient presents today with complaint of shortness of breath on exertion. She  states that these symptoms began after her outpatient TEE last week. She is unable to lay flat due to increased shortness of breath.  She states that her breathing is worse when lying down and on her left side and resolves when sitting up.  She has a cough as well when lying flat that gets better with positional change.  She denies palpitations,, nausea, vomiting, dizziness, syncope,or chest pain. She euvolemic on exam with no increased work of breathing or distress.  She is in normal sinus rhythm today with heart rate of 65.       Past Medical History    Past  Medical History:  Diagnosis Date   Deafness 1937   following meningitis   Diabetes mellitus without complication ([place])    Dyspnea    at night if the house is hot   Family history of breast cancer    Family history of colonic polyps    Family history of ovarian cancer    GERD (gastroesophageal reflux disease)    occasionally   Headache    sometimes   Hyperlipidemia    Hypertension    Meningitis    Mitral regurgitation    severe by echo 04/2021   Osteoporosis    Pneumonia    yrs. ago   Pulmonary HTN ([place])    PASP in the 50's by echo 04/2021   Pulmonic regurgitation    moderate by echo 04/2021   Tricuspid regurgitation    moderate to severe by echo 04/2021   Past Surgical History:  Procedure Laterality Date   ABDOMINAL HYSTERECTOMY     BREAST LUMPECTOMY WITH RADIOACTIVE SEED LOCALIZATION Left 05/15/2017   Procedure: BREAST LUMPECTOMY WITH RADIOACTIVE SEED LOCALIZATION;  Surgeon: Fanny Skates, MD;  Location: [facility];  Service: General;  Laterality: Left;   RIGHT/LEFT HEART CATH AND CORONARY ANGIOGRAPHY N/A 06/01/2021   Procedure: RIGHT/LEFT HEART CATH AND CORONARY ANGIOGRAPHY;  Surgeon: Martinique, Peter M, MD;  Location: [facility];  Service: Cardiovascular;  Laterality: N/A;   TEE WITHOUT CARDIOVERSION N/A 06/06/2021   Procedure: TRANSESOPHAGEAL ECHOCARDIOGRAM (TEE);  Surgeon: Sanda Klein, MD;  Location[facility] END[facility];  Service: Cardiovascular;  Laterality: N/A;    Allergies  Allergies  Allergen Reactions   Altace [Ramipril] Cough   Hyzaar [Losartan Potassium-Hctz] Cough   Hydrocodone Cough         Home Medications    Current Outpatient Medications  Medication Sig Dispense Refill   acetaminophen (TYLENOL) 500 MG tablet Take 500 mg by mouth daily as needed for mild pain or headache.     amiodarone (PACERONE) 200 MG tablet Take 2 tablets (400 mg total) by mouth 2 (two) times daily for 14 days, THEN 1 tablet (200 mg total) 2 (two) times daily for 14 days, THEN 1  tablet (200 mg total) daily for 14 days. 98 tablet 0   amLODipine (NORVASC) 5 MG tablet Take 5 mg by mouth every morning.     apixaban (ELIQUIS) 2.5 MG TABS tablet Take 1 tablet (2.5 mg total) by mouth 2 (two) times daily. 60 tablet 0   atorvastatin (LIPITOR) 20 MG tablet Take 1 tablet (20 mg total) by mouth daily. 30 tablet 0   Carboxymethylcellul-Glycerin (REFRESH RELIEVA PF OP) Place 1 drop into both eyes 4 (four) times daily.     Cholecalciferol (VITAMIN D3 PO) Take 1 tablet by mouth See admin instructions. Chew 1 gummy by mouth twice weekly - Tuesday and Thursday     Cyanocobalamin (VITAMIN B12 TR PO) Take 1 tablet by mouth daily. OTC gummy     Ferrous Sulfate (IRON PO) Take 1 tablet by mouth daily. OTC gummy     FOLIC ACID PO Take 1 tablet by mouth daily. OTC gummy     furosemide (LASIX) 20 MG tablet Take 1 tablet (20 mg total) by mouth daily as needed. 90 tablet 0   hydrochlorothiazide (HYDRODIURIL) 25 MG tablet Take 25 mg by mouth every morning.     metFORMIN (GLUCOPHAGE-XR) 500 MG 24 hr tablet Take 1 tablet (500 mg total) by mouth daily with supper.     metoprolol succinate (TOPROL-XL) 100 MG 24 hr tablet Take 100 mg by mouth every morning. Take with or immediately following a meal.     Multiple Vitamins-Minerals (ADULT ONE DAILY GUMMIES) CHEW Chew 1 tablet by mouth daily.     Omega-3 Fatty Acids (FISH OIL PO) Take 2 tablets by mouth daily. OTC gummy     ondansetron (ZOFRAN) 4 MG tablet Take 1 tablet (4 mg total) by mouth every 6 (six) hours as needed for nausea. 20 tablet 0   vitamin C (ASCORBIC ACID) 500 MG tablet Take 500 mg by mouth daily.     VITAMIN E PO Take 1 tablet by mouth daily. OTC gummy     No current facility-administered medications for this visit.     Review of Systems   General:  No chills, fever, night sweats or weight changes.  Cardiovascular:  No chest pain, does endorse dyspnea on exertion that resolves with rest no, palpitations or paroxysmal nocturnal  dyspnea. Dermatological: No rash, lesions/masses Respiratory: Nonproductive cough and shortness of breath with exertion, Urologic: No hematuria, dysuria Abdominal:   No nausea, vomiting, diarrhea, bright red blood per rectum, melena, or hematemesis Neurologic:  No visual changes, wkns, changes in mental status. All other systems reviewed and are otherwise negative except as noted above.    Physical Exam    Wt Readings from Last 3 Encounters:  06/06/21 126 lb (57.2 kg)  06/02/21 124 lb 1.9 oz (56.3 kg)  05/10/21 126 lb 9.6 oz (57.4 kg)   VS:  Vitals:   06/14/21 0800  BP: 130/60  Pulse: 65  SpO2: 93%  GEN: Well nourished, well developed, in no acute distress. Neck: Supple, no JVD, carotid bruits, or masses. Cardiac: RRR, no murmurs, rubs, or gallops. No clubbing, cyanosis, edema.  Radials/PT 2+ and equal bilaterally.  Respiratory:  Respirations regular and unlabored, clear to auscultation bilaterally. MS: no deformity or atrophy. Skin: warm and dry, no rash. Neuro:  Strength and sensation are intact. Psych: Normal affect.  Accessory Clinical Findings    ECG personally reviewed by me today -sinus rhythm rate of 65- no acute changes.  Risk Assessment/Calculations:    CHA2DS2-VASc Score =   4  This indicates a  % annual risk of stroke. The patient's score is based upon:    Lab Results  Component Value Date   CREATININE 1.16 (H) 06/02/2021   BUN 26 (H) 06/02/2021   NA 138 06/02/2021   K 4.6 06/02/2021   CL 103 06/02/2021   CO2 27 06/02/2021   Lab Results  Component Value Date   ALT 15 06/02/2021   AST 20 06/02/2021   ALKPHOS 18 (L) 06/02/2021   BILITOT 0.4 06/02/2021   No results found for: CHOL, HDL, LDLCALC, LDLDIRECT, TRIG, CHOLHDL  Lab Results  Component Value Date   HGBA1C 6.8 (H) 05/31/2021    Assessment & Plan    1.  New onset atrial fibrillation with RVR: -Presented to ER in AF/RVR new onset spontaneously converted on Cardizem drip now on  amiodarone 400 mg and maintaining sinus rhythm. -CHA2DS2-VASc 5 continue Eliquis 2.5 mg -Continue Toprol XL 100 mg  2.  HFpEF with severe mitral regurgitation: -Patient is currently in the process of being worked up for mitral clip.  TEE completed on 2/28 and cardiac CT scheduled for 3/17 -Euvolemic on examination today, we will add Lasix 20 mg as needed.  She will take Lasix 20 mg for the next 3 days and will call us if her symptoms have not improved. -Continue Toprol XL 100 mg -Continue HCTZ 25 mg -Amlodipine 5 mg -BMET in 2 weeks  3.  Hypertension: -Blood pressure today is well controlled at 130/60 -Continue Toprol XL 100 mg -Continue amlodipine 5 mg  4.  DM II: -Last hemoglobin A1c was 6.8 -Continue current drug regimen per PCP -HCTZ 25 mg  Disposition: Follow-up with structural heart team in 4 to 6 weeks  Medication Adjustments/Labs and Tests Ordered: Current medicines are reviewed at length with the patient today.  Concerns regarding medicines are outlined above.  Tests Ordered: No orders of the defined types were placed in this encounter.  Medication Changes: No orders of the defined types were placed in this encounter.    Mable Fill, Marissa Nestle, NP 06/13/2021, 5:11 PM

## 2021-06-13 NOTE — Telephone Encounter (Signed)
The patient was discussed in Valve Meeting this AM. ?Per Dr. Ali Lowe, she will need a cardiac CT (TAVR protocol) to get a more precise look at the calcium on the mitral valve.  ? ?Left message for Katheren Puller (the patient's local daughter) to call back to discuss logistics. ?

## 2021-06-13 NOTE — Telephone Encounter (Signed)
Unable to reach patient. She is scheduled to see Richardson Dopp, PA-C tomorrow 3/8.  ?

## 2021-06-14 ENCOUNTER — Encounter: Payer: Self-pay | Admitting: Nurse Practitioner

## 2021-06-14 ENCOUNTER — Other Ambulatory Visit: Payer: Self-pay

## 2021-06-14 ENCOUNTER — Ambulatory Visit: Payer: Medicare HMO | Admitting: Nurse Practitioner

## 2021-06-14 ENCOUNTER — Encounter: Payer: Self-pay | Admitting: *Deleted

## 2021-06-14 VITALS — BP 130/60 | HR 65 | Ht 62.0 in | Wt 125.8 lb

## 2021-06-14 DIAGNOSIS — R0602 Shortness of breath: Secondary | ICD-10-CM | POA: Diagnosis not present

## 2021-06-14 DIAGNOSIS — I4891 Unspecified atrial fibrillation: Secondary | ICD-10-CM

## 2021-06-14 DIAGNOSIS — I5032 Chronic diastolic (congestive) heart failure: Secondary | ICD-10-CM

## 2021-06-14 DIAGNOSIS — I340 Nonrheumatic mitral (valve) insufficiency: Secondary | ICD-10-CM

## 2021-06-14 DIAGNOSIS — I10 Essential (primary) hypertension: Secondary | ICD-10-CM | POA: Diagnosis not present

## 2021-06-14 MED ORDER — ATORVASTATIN CALCIUM 20 MG PO TABS: 20.0000 mg | ORAL_TABLET | Freq: Every day | ORAL | 3 refills | Status: AC

## 2021-06-14 MED ORDER — FUROSEMIDE 20 MG PO TABS: 20.0000 mg | ORAL_TABLET | Freq: Every day | ORAL | 1 refills | Status: DC

## 2021-06-14 NOTE — Patient Instructions (Signed)
Medication Instructions:  ? ?START Lasix one ( 1) tablet by mouth ( 20 mg) X 3 days, than take just as needed for weight gain of 3 lbs in 24 hours. Have your daughter call office at [phone] to let us know how you are feeling and if there is no change.  ? ?*If you need a refill on your cardiac medications before your next appointment, please call your pharmacy* ? ?Testing/Procedures: ? ?See paperwork. ? ? ?Follow-Up: ?At [facility], you and your health needs are our priority.  As part of our continuing mission to provide you with exceptional heart care, we have created designated Provider Care Teams.  These Care Teams include your primary Cardiologist (physician) and Advanced Practice Providers (APPs -  Physician Assistants and Nurse Practitioners) who all work together to provide you with the care you need, when you need it. ? ?We recommend signing up for the patient portal called "MyChart".  Sign up information is provided on this After Visit Summary.  MyChart is used to connect with patients for Virtual Visits (Telemedicine).  Patients are able to view lab/test results, encounter notes, upcoming appointments, etc.  Non-urgent messages can be sent to your provider as well.   ?To learn more about what you can do with MyChart, go to [email or web address].   ? ?Your next appointment:   ?6 week(s) ? ?The format for your next appointment:   ?In Person ? ?Provider:   ?Richardson Dopp, PA-C       ? ? ?Other Instructions ? ?  ?

## 2021-06-14 NOTE — Telephone Encounter (Signed)
This encounter was created in error - please disregard.

## 2021-06-14 NOTE — Telephone Encounter (Signed)
Spoke with Dr. Ali Lowe after the patient's office visit today.  ?Per Dr. Ali Lowe, the patient will keep cardiac CT as scheduled 3/17 and will schedule her with him after that for a follow-up appointment. ? ?Discussed with the patient's daughter, Katheren Puller.  ?She understands the patient is scheduled for CT on 3/17. ?She will bring the patient to follow-up with Dr. Ali Lowe on 3/22. ?Will send updated instruction letter.  ?Pam was grateful for call and agrees with plan.  ?

## 2021-06-15 ENCOUNTER — Telehealth (HOSPITAL_COMMUNITY): Payer: Self-pay | Admitting: Licensed Clinical Social Worker

## 2021-06-15 ENCOUNTER — Telehealth: Payer: Self-pay | Admitting: Licensed Clinical Social Worker

## 2021-06-15 NOTE — Telephone Encounter (Signed)
CSW confirmed door to door transportation for patient's appointment on 06-23-21 for cardiac CT. Patient's daughter notified by RN of transportation plan. CSW available as needed. Raquel Sarna, [place], [place] ? ?

## 2021-06-15 NOTE — Telephone Encounter (Signed)
CSW received request to schedule door to door transportation to [facility] for Cardiac CT on 07-03-21. CSW sent message to [facility] Transportation to schedule and awaiting response. CSW will contact patient's daughter to confirm arrangements once confirmation received from [employer]. Raquel Sarna, [place], [place] ? ?

## 2021-06-19 NOTE — Telephone Encounter (Signed)
Dr. Ali Lowe personally spoke with the patient's daughter, Maria Collier, over the weekend. It was agreed that the patient will be referred to [facility] so the patient can stay with [facility] for care. ? ?Imaging pushed through powershare. ? ?Formal referral placed. ?

## 2021-06-21 NOTE — Addendum Note (Signed)
Addended by: Harland German A on: 06/21/2021 04:19 PM ? ? Modules accepted: Orders ? ?

## 2021-06-21 NOTE — Telephone Encounter (Signed)
Spoke with the patient's daughter, [name], in detail about the plan. ?Confirmed that cCT, appointment with Dr. Ali Lowe, and appointment with Dr. Kipp Brood are all cancelled.  ?Informed her I will confirm with Raquel Sarna that transportation is no longer needed. ? ?Confirmed with [name] that she still wants referral to [facility] (Dr. Patrecia Pour) even though Dr. Chauncey Reading is no longer there. ? ?She was grateful for call and agrees with plan.  ?

## 2021-06-23 ENCOUNTER — Encounter: Payer: Medicare HMO | Admitting: Thoracic Surgery (Cardiothoracic Vascular Surgery)

## 2021-06-23 ENCOUNTER — Ambulatory Visit (HOSPITAL_COMMUNITY): Admission: RE | Admit: 2021-06-23 | Payer: Medicare HMO | Source: Ambulatory Visit

## 2021-06-26 ENCOUNTER — Ambulatory Visit: Payer: Medicare HMO | Admitting: Internal Medicine

## 2021-06-26 ENCOUNTER — Telehealth: Payer: Self-pay

## 2021-06-26 NOTE — Telephone Encounter (Signed)
See 06/13/2021 phone note. ? ?The patient was referred to Dr. Patrecia Pour at [facility] last week for evaluation of MR. ?Confirmed with Amy, [facility] Valve Coordinator, that the referral was received. ?She will call back if there are questions or concerns. ?

## 2021-06-28 ENCOUNTER — Ambulatory Visit: Payer: Medicare HMO | Admitting: Internal Medicine

## 2021-06-30 ENCOUNTER — Encounter: Payer: Medicare HMO | Admitting: Thoracic Surgery (Cardiothoracic Vascular Surgery)

## 2021-06-30 NOTE — Telephone Encounter (Signed)
The patient is scheduled for consult at [facility] 07/25/2021. ?

## 2021-07-04 DIAGNOSIS — H52203 Unspecified astigmatism, bilateral: Secondary | ICD-10-CM | POA: Diagnosis not present

## 2021-07-04 DIAGNOSIS — E119 Type 2 diabetes mellitus without complications: Secondary | ICD-10-CM | POA: Diagnosis not present

## 2021-07-04 DIAGNOSIS — Z961 Presence of intraocular lens: Secondary | ICD-10-CM | POA: Diagnosis not present

## 2021-07-25 DIAGNOSIS — I340 Nonrheumatic mitral (valve) insufficiency: Secondary | ICD-10-CM | POA: Diagnosis not present

## 2021-07-26 ENCOUNTER — Ambulatory Visit: Payer: Medicare HMO | Admitting: Physician Assistant

## 2021-08-01 DIAGNOSIS — Z01812 Encounter for preprocedural laboratory examination: Secondary | ICD-10-CM | POA: Diagnosis not present

## 2021-08-01 DIAGNOSIS — I059 Rheumatic mitral valve disease, unspecified: Secondary | ICD-10-CM | POA: Diagnosis not present

## 2021-08-01 DIAGNOSIS — Z01810 Encounter for preprocedural cardiovascular examination: Secondary | ICD-10-CM | POA: Diagnosis not present

## 2021-08-01 DIAGNOSIS — I5043 Acute on chronic combined systolic (congestive) and diastolic (congestive) heart failure: Secondary | ICD-10-CM | POA: Diagnosis not present

## 2021-08-01 DIAGNOSIS — R300 Dysuria: Secondary | ICD-10-CM | POA: Diagnosis not present

## 2021-08-08 DIAGNOSIS — Z01810 Encounter for preprocedural cardiovascular examination: Secondary | ICD-10-CM | POA: Diagnosis not present

## 2021-08-08 DIAGNOSIS — I059 Rheumatic mitral valve disease, unspecified: Secondary | ICD-10-CM | POA: Diagnosis not present

## 2021-08-08 DIAGNOSIS — I252 Old myocardial infarction: Secondary | ICD-10-CM | POA: Diagnosis not present

## 2021-08-08 DIAGNOSIS — Z01812 Encounter for preprocedural laboratory examination: Secondary | ICD-10-CM | POA: Diagnosis not present

## 2021-08-08 DIAGNOSIS — R9431 Abnormal electrocardiogram [ECG] [EKG]: Secondary | ICD-10-CM | POA: Diagnosis not present

## 2021-08-15 DIAGNOSIS — I340 Nonrheumatic mitral (valve) insufficiency: Secondary | ICD-10-CM | POA: Diagnosis not present

## 2021-08-17 DIAGNOSIS — R0602 Shortness of breath: Secondary | ICD-10-CM | POA: Diagnosis not present

## 2021-08-17 DIAGNOSIS — I371 Nonrheumatic pulmonary valve insufficiency: Secondary | ICD-10-CM | POA: Diagnosis not present

## 2021-08-17 DIAGNOSIS — Z952 Presence of prosthetic heart valve: Secondary | ICD-10-CM | POA: Diagnosis not present

## 2021-08-17 DIAGNOSIS — I340 Nonrheumatic mitral (valve) insufficiency: Secondary | ICD-10-CM | POA: Diagnosis not present

## 2021-09-14 DIAGNOSIS — E785 Hyperlipidemia, unspecified: Secondary | ICD-10-CM | POA: Diagnosis not present

## 2021-09-14 DIAGNOSIS — E119 Type 2 diabetes mellitus without complications: Secondary | ICD-10-CM | POA: Diagnosis not present

## 2021-09-14 DIAGNOSIS — I110 Hypertensive heart disease with heart failure: Secondary | ICD-10-CM | POA: Diagnosis not present

## 2021-09-14 DIAGNOSIS — I5042 Chronic combined systolic (congestive) and diastolic (congestive) heart failure: Secondary | ICD-10-CM | POA: Diagnosis not present

## 2021-09-14 DIAGNOSIS — I4891 Unspecified atrial fibrillation: Secondary | ICD-10-CM | POA: Diagnosis not present

## 2021-09-14 DIAGNOSIS — I081 Rheumatic disorders of both mitral and tricuspid valves: Secondary | ICD-10-CM | POA: Diagnosis not present

## 2021-09-14 DIAGNOSIS — Z7984 Long term (current) use of oral hypoglycemic drugs: Secondary | ICD-10-CM | POA: Diagnosis not present

## 2021-09-14 DIAGNOSIS — Z48812 Encounter for surgical aftercare following surgery on the circulatory system: Secondary | ICD-10-CM | POA: Diagnosis not present

## 2021-09-18 DIAGNOSIS — I059 Rheumatic mitral valve disease, unspecified: Secondary | ICD-10-CM | POA: Diagnosis not present

## 2021-09-20 DIAGNOSIS — E785 Hyperlipidemia, unspecified: Secondary | ICD-10-CM | POA: Diagnosis not present

## 2021-09-20 DIAGNOSIS — I5042 Chronic combined systolic (congestive) and diastolic (congestive) heart failure: Secondary | ICD-10-CM | POA: Diagnosis not present

## 2021-09-20 DIAGNOSIS — I110 Hypertensive heart disease with heart failure: Secondary | ICD-10-CM | POA: Diagnosis not present

## 2021-09-20 DIAGNOSIS — Z48812 Encounter for surgical aftercare following surgery on the circulatory system: Secondary | ICD-10-CM | POA: Diagnosis not present

## 2021-09-20 DIAGNOSIS — Z7984 Long term (current) use of oral hypoglycemic drugs: Secondary | ICD-10-CM | POA: Diagnosis not present

## 2021-09-20 DIAGNOSIS — I081 Rheumatic disorders of both mitral and tricuspid valves: Secondary | ICD-10-CM | POA: Diagnosis not present

## 2021-09-20 DIAGNOSIS — I4891 Unspecified atrial fibrillation: Secondary | ICD-10-CM | POA: Diagnosis not present

## 2021-09-20 DIAGNOSIS — E119 Type 2 diabetes mellitus without complications: Secondary | ICD-10-CM | POA: Diagnosis not present

## 2021-09-22 DIAGNOSIS — Z09 Encounter for follow-up examination after completed treatment for conditions other than malignant neoplasm: Secondary | ICD-10-CM | POA: Diagnosis not present

## 2021-09-22 DIAGNOSIS — I071 Rheumatic tricuspid insufficiency: Secondary | ICD-10-CM | POA: Diagnosis not present

## 2021-09-22 DIAGNOSIS — I480 Paroxysmal atrial fibrillation: Secondary | ICD-10-CM | POA: Diagnosis not present

## 2021-09-22 DIAGNOSIS — Z95818 Presence of other cardiac implants and grafts: Secondary | ICD-10-CM | POA: Diagnosis not present

## 2021-09-25 DIAGNOSIS — E119 Type 2 diabetes mellitus without complications: Secondary | ICD-10-CM | POA: Diagnosis not present

## 2021-09-25 DIAGNOSIS — I110 Hypertensive heart disease with heart failure: Secondary | ICD-10-CM | POA: Diagnosis not present

## 2021-09-25 DIAGNOSIS — I5042 Chronic combined systolic (congestive) and diastolic (congestive) heart failure: Secondary | ICD-10-CM | POA: Diagnosis not present

## 2021-09-25 DIAGNOSIS — I081 Rheumatic disorders of both mitral and tricuspid valves: Secondary | ICD-10-CM | POA: Diagnosis not present

## 2021-09-25 DIAGNOSIS — I4891 Unspecified atrial fibrillation: Secondary | ICD-10-CM | POA: Diagnosis not present

## 2021-09-25 DIAGNOSIS — Z7984 Long term (current) use of oral hypoglycemic drugs: Secondary | ICD-10-CM | POA: Diagnosis not present

## 2021-09-25 DIAGNOSIS — Z48812 Encounter for surgical aftercare following surgery on the circulatory system: Secondary | ICD-10-CM | POA: Diagnosis not present

## 2021-09-25 DIAGNOSIS — E785 Hyperlipidemia, unspecified: Secondary | ICD-10-CM | POA: Diagnosis not present

## 2021-09-27 DIAGNOSIS — Z48812 Encounter for surgical aftercare following surgery on the circulatory system: Secondary | ICD-10-CM | POA: Diagnosis not present

## 2021-09-27 DIAGNOSIS — I4891 Unspecified atrial fibrillation: Secondary | ICD-10-CM | POA: Diagnosis not present

## 2021-09-27 DIAGNOSIS — I081 Rheumatic disorders of both mitral and tricuspid valves: Secondary | ICD-10-CM | POA: Diagnosis not present

## 2021-09-27 DIAGNOSIS — E119 Type 2 diabetes mellitus without complications: Secondary | ICD-10-CM | POA: Diagnosis not present

## 2021-09-27 DIAGNOSIS — E785 Hyperlipidemia, unspecified: Secondary | ICD-10-CM | POA: Diagnosis not present

## 2021-09-27 DIAGNOSIS — Z7984 Long term (current) use of oral hypoglycemic drugs: Secondary | ICD-10-CM | POA: Diagnosis not present

## 2021-09-27 DIAGNOSIS — I110 Hypertensive heart disease with heart failure: Secondary | ICD-10-CM | POA: Diagnosis not present

## 2021-09-27 DIAGNOSIS — I5042 Chronic combined systolic (congestive) and diastolic (congestive) heart failure: Secondary | ICD-10-CM | POA: Diagnosis not present

## 2021-09-29 DIAGNOSIS — I110 Hypertensive heart disease with heart failure: Secondary | ICD-10-CM | POA: Diagnosis not present

## 2021-09-29 DIAGNOSIS — E119 Type 2 diabetes mellitus without complications: Secondary | ICD-10-CM | POA: Diagnosis not present

## 2021-09-29 DIAGNOSIS — E785 Hyperlipidemia, unspecified: Secondary | ICD-10-CM | POA: Diagnosis not present

## 2021-09-29 DIAGNOSIS — I4891 Unspecified atrial fibrillation: Secondary | ICD-10-CM | POA: Diagnosis not present

## 2021-09-29 DIAGNOSIS — Z7984 Long term (current) use of oral hypoglycemic drugs: Secondary | ICD-10-CM | POA: Diagnosis not present

## 2021-09-29 DIAGNOSIS — I5042 Chronic combined systolic (congestive) and diastolic (congestive) heart failure: Secondary | ICD-10-CM | POA: Diagnosis not present

## 2021-09-29 DIAGNOSIS — Z48812 Encounter for surgical aftercare following surgery on the circulatory system: Secondary | ICD-10-CM | POA: Diagnosis not present

## 2021-09-29 DIAGNOSIS — I081 Rheumatic disorders of both mitral and tricuspid valves: Secondary | ICD-10-CM | POA: Diagnosis not present

## 2021-10-02 DIAGNOSIS — I110 Hypertensive heart disease with heart failure: Secondary | ICD-10-CM | POA: Diagnosis not present

## 2021-10-02 DIAGNOSIS — Z7984 Long term (current) use of oral hypoglycemic drugs: Secondary | ICD-10-CM | POA: Diagnosis not present

## 2021-10-02 DIAGNOSIS — Z48812 Encounter for surgical aftercare following surgery on the circulatory system: Secondary | ICD-10-CM | POA: Diagnosis not present

## 2021-10-02 DIAGNOSIS — I4891 Unspecified atrial fibrillation: Secondary | ICD-10-CM | POA: Diagnosis not present

## 2021-10-02 DIAGNOSIS — I5042 Chronic combined systolic (congestive) and diastolic (congestive) heart failure: Secondary | ICD-10-CM | POA: Diagnosis not present

## 2021-10-02 DIAGNOSIS — E785 Hyperlipidemia, unspecified: Secondary | ICD-10-CM | POA: Diagnosis not present

## 2021-10-02 DIAGNOSIS — I081 Rheumatic disorders of both mitral and tricuspid valves: Secondary | ICD-10-CM | POA: Diagnosis not present

## 2021-10-02 DIAGNOSIS — E119 Type 2 diabetes mellitus without complications: Secondary | ICD-10-CM | POA: Diagnosis not present

## 2021-10-04 DIAGNOSIS — E119 Type 2 diabetes mellitus without complications: Secondary | ICD-10-CM | POA: Diagnosis not present

## 2021-10-04 DIAGNOSIS — Z48812 Encounter for surgical aftercare following surgery on the circulatory system: Secondary | ICD-10-CM | POA: Diagnosis not present

## 2021-10-04 DIAGNOSIS — I081 Rheumatic disorders of both mitral and tricuspid valves: Secondary | ICD-10-CM | POA: Diagnosis not present

## 2021-10-04 DIAGNOSIS — I110 Hypertensive heart disease with heart failure: Secondary | ICD-10-CM | POA: Diagnosis not present

## 2021-10-04 DIAGNOSIS — E785 Hyperlipidemia, unspecified: Secondary | ICD-10-CM | POA: Diagnosis not present

## 2021-10-04 DIAGNOSIS — I4891 Unspecified atrial fibrillation: Secondary | ICD-10-CM | POA: Diagnosis not present

## 2021-10-04 DIAGNOSIS — I5042 Chronic combined systolic (congestive) and diastolic (congestive) heart failure: Secondary | ICD-10-CM | POA: Diagnosis not present

## 2021-10-04 DIAGNOSIS — Z7984 Long term (current) use of oral hypoglycemic drugs: Secondary | ICD-10-CM | POA: Diagnosis not present

## 2021-10-06 DIAGNOSIS — E119 Type 2 diabetes mellitus without complications: Secondary | ICD-10-CM | POA: Diagnosis not present

## 2021-10-06 DIAGNOSIS — I110 Hypertensive heart disease with heart failure: Secondary | ICD-10-CM | POA: Diagnosis not present

## 2021-10-06 DIAGNOSIS — Z48812 Encounter for surgical aftercare following surgery on the circulatory system: Secondary | ICD-10-CM | POA: Diagnosis not present

## 2021-10-06 DIAGNOSIS — I5042 Chronic combined systolic (congestive) and diastolic (congestive) heart failure: Secondary | ICD-10-CM | POA: Diagnosis not present

## 2021-10-06 DIAGNOSIS — I081 Rheumatic disorders of both mitral and tricuspid valves: Secondary | ICD-10-CM | POA: Diagnosis not present

## 2021-10-06 DIAGNOSIS — I4891 Unspecified atrial fibrillation: Secondary | ICD-10-CM | POA: Diagnosis not present

## 2021-10-06 DIAGNOSIS — Z7984 Long term (current) use of oral hypoglycemic drugs: Secondary | ICD-10-CM | POA: Diagnosis not present

## 2021-10-06 DIAGNOSIS — E785 Hyperlipidemia, unspecified: Secondary | ICD-10-CM | POA: Diagnosis not present

## 2021-10-09 DIAGNOSIS — Z48812 Encounter for surgical aftercare following surgery on the circulatory system: Secondary | ICD-10-CM | POA: Diagnosis not present

## 2021-10-09 DIAGNOSIS — E785 Hyperlipidemia, unspecified: Secondary | ICD-10-CM | POA: Diagnosis not present

## 2021-10-09 DIAGNOSIS — Z7984 Long term (current) use of oral hypoglycemic drugs: Secondary | ICD-10-CM | POA: Diagnosis not present

## 2021-10-09 DIAGNOSIS — E119 Type 2 diabetes mellitus without complications: Secondary | ICD-10-CM | POA: Diagnosis not present

## 2021-10-09 DIAGNOSIS — I110 Hypertensive heart disease with heart failure: Secondary | ICD-10-CM | POA: Diagnosis not present

## 2021-10-09 DIAGNOSIS — I4891 Unspecified atrial fibrillation: Secondary | ICD-10-CM | POA: Diagnosis not present

## 2021-10-09 DIAGNOSIS — I081 Rheumatic disorders of both mitral and tricuspid valves: Secondary | ICD-10-CM | POA: Diagnosis not present

## 2021-10-09 DIAGNOSIS — I5042 Chronic combined systolic (congestive) and diastolic (congestive) heart failure: Secondary | ICD-10-CM | POA: Diagnosis not present

## 2021-10-16 DIAGNOSIS — E785 Hyperlipidemia, unspecified: Secondary | ICD-10-CM | POA: Diagnosis not present

## 2021-10-16 DIAGNOSIS — I110 Hypertensive heart disease with heart failure: Secondary | ICD-10-CM | POA: Diagnosis not present

## 2021-10-16 DIAGNOSIS — Z7984 Long term (current) use of oral hypoglycemic drugs: Secondary | ICD-10-CM | POA: Diagnosis not present

## 2021-10-16 DIAGNOSIS — E119 Type 2 diabetes mellitus without complications: Secondary | ICD-10-CM | POA: Diagnosis not present

## 2021-10-16 DIAGNOSIS — Z48812 Encounter for surgical aftercare following surgery on the circulatory system: Secondary | ICD-10-CM | POA: Diagnosis not present

## 2021-10-16 DIAGNOSIS — I4891 Unspecified atrial fibrillation: Secondary | ICD-10-CM | POA: Diagnosis not present

## 2021-10-16 DIAGNOSIS — I5042 Chronic combined systolic (congestive) and diastolic (congestive) heart failure: Secondary | ICD-10-CM | POA: Diagnosis not present

## 2021-10-16 DIAGNOSIS — I081 Rheumatic disorders of both mitral and tricuspid valves: Secondary | ICD-10-CM | POA: Diagnosis not present

## 2021-10-18 DIAGNOSIS — E119 Type 2 diabetes mellitus without complications: Secondary | ICD-10-CM | POA: Diagnosis not present

## 2021-10-18 DIAGNOSIS — Z48812 Encounter for surgical aftercare following surgery on the circulatory system: Secondary | ICD-10-CM | POA: Diagnosis not present

## 2021-10-18 DIAGNOSIS — I081 Rheumatic disorders of both mitral and tricuspid valves: Secondary | ICD-10-CM | POA: Diagnosis not present

## 2021-10-18 DIAGNOSIS — I4891 Unspecified atrial fibrillation: Secondary | ICD-10-CM | POA: Diagnosis not present

## 2021-10-18 DIAGNOSIS — E785 Hyperlipidemia, unspecified: Secondary | ICD-10-CM | POA: Diagnosis not present

## 2021-10-18 DIAGNOSIS — Z7984 Long term (current) use of oral hypoglycemic drugs: Secondary | ICD-10-CM | POA: Diagnosis not present

## 2021-10-18 DIAGNOSIS — I5042 Chronic combined systolic (congestive) and diastolic (congestive) heart failure: Secondary | ICD-10-CM | POA: Diagnosis not present

## 2021-10-18 DIAGNOSIS — I110 Hypertensive heart disease with heart failure: Secondary | ICD-10-CM | POA: Diagnosis not present

## 2021-10-20 DIAGNOSIS — I5042 Chronic combined systolic (congestive) and diastolic (congestive) heart failure: Secondary | ICD-10-CM | POA: Diagnosis not present

## 2021-10-20 DIAGNOSIS — Z48812 Encounter for surgical aftercare following surgery on the circulatory system: Secondary | ICD-10-CM | POA: Diagnosis not present

## 2021-10-20 DIAGNOSIS — I4891 Unspecified atrial fibrillation: Secondary | ICD-10-CM | POA: Diagnosis not present

## 2021-10-20 DIAGNOSIS — E785 Hyperlipidemia, unspecified: Secondary | ICD-10-CM | POA: Diagnosis not present

## 2021-10-20 DIAGNOSIS — Z7984 Long term (current) use of oral hypoglycemic drugs: Secondary | ICD-10-CM | POA: Diagnosis not present

## 2021-10-20 DIAGNOSIS — I110 Hypertensive heart disease with heart failure: Secondary | ICD-10-CM | POA: Diagnosis not present

## 2021-10-20 DIAGNOSIS — I081 Rheumatic disorders of both mitral and tricuspid valves: Secondary | ICD-10-CM | POA: Diagnosis not present

## 2021-10-20 DIAGNOSIS — E119 Type 2 diabetes mellitus without complications: Secondary | ICD-10-CM | POA: Diagnosis not present

## 2021-10-30 DIAGNOSIS — E1121 Type 2 diabetes mellitus with diabetic nephropathy: Secondary | ICD-10-CM | POA: Diagnosis not present

## 2021-10-30 DIAGNOSIS — H918X3 Other specified hearing loss, bilateral: Secondary | ICD-10-CM | POA: Diagnosis not present

## 2021-10-30 DIAGNOSIS — K219 Gastro-esophageal reflux disease without esophagitis: Secondary | ICD-10-CM | POA: Diagnosis not present

## 2021-10-30 DIAGNOSIS — I129 Hypertensive chronic kidney disease with stage 1 through stage 4 chronic kidney disease, or unspecified chronic kidney disease: Secondary | ICD-10-CM | POA: Diagnosis not present

## 2021-10-30 DIAGNOSIS — R5381 Other malaise: Secondary | ICD-10-CM | POA: Diagnosis not present

## 2021-10-30 DIAGNOSIS — Z952 Presence of prosthetic heart valve: Secondary | ICD-10-CM | POA: Diagnosis not present

## 2021-10-30 DIAGNOSIS — I4891 Unspecified atrial fibrillation: Secondary | ICD-10-CM | POA: Diagnosis not present

## 2021-11-14 DIAGNOSIS — Z7984 Long term (current) use of oral hypoglycemic drugs: Secondary | ICD-10-CM | POA: Diagnosis not present

## 2021-11-14 DIAGNOSIS — I4891 Unspecified atrial fibrillation: Secondary | ICD-10-CM | POA: Diagnosis not present

## 2021-11-14 DIAGNOSIS — I129 Hypertensive chronic kidney disease with stage 1 through stage 4 chronic kidney disease, or unspecified chronic kidney disease: Secondary | ICD-10-CM | POA: Diagnosis not present

## 2021-11-14 DIAGNOSIS — Z952 Presence of prosthetic heart valve: Secondary | ICD-10-CM | POA: Diagnosis not present

## 2021-11-14 DIAGNOSIS — Z7901 Long term (current) use of anticoagulants: Secondary | ICD-10-CM | POA: Diagnosis not present

## 2021-11-14 DIAGNOSIS — E1122 Type 2 diabetes mellitus with diabetic chronic kidney disease: Secondary | ICD-10-CM | POA: Diagnosis not present

## 2021-11-14 DIAGNOSIS — N183 Chronic kidney disease, stage 3 unspecified: Secondary | ICD-10-CM | POA: Diagnosis not present

## 2021-11-14 DIAGNOSIS — Z9181 History of falling: Secondary | ICD-10-CM | POA: Diagnosis not present

## 2021-11-14 DIAGNOSIS — E1121 Type 2 diabetes mellitus with diabetic nephropathy: Secondary | ICD-10-CM | POA: Diagnosis not present

## 2021-11-14 DIAGNOSIS — H9193 Unspecified hearing loss, bilateral: Secondary | ICD-10-CM | POA: Diagnosis not present

## 2021-11-14 DIAGNOSIS — K219 Gastro-esophageal reflux disease without esophagitis: Secondary | ICD-10-CM | POA: Diagnosis not present

## 2021-11-22 DIAGNOSIS — Z7984 Long term (current) use of oral hypoglycemic drugs: Secondary | ICD-10-CM | POA: Diagnosis not present

## 2021-11-22 DIAGNOSIS — I4891 Unspecified atrial fibrillation: Secondary | ICD-10-CM | POA: Diagnosis not present

## 2021-11-22 DIAGNOSIS — Z952 Presence of prosthetic heart valve: Secondary | ICD-10-CM | POA: Diagnosis not present

## 2021-11-22 DIAGNOSIS — N183 Chronic kidney disease, stage 3 unspecified: Secondary | ICD-10-CM | POA: Diagnosis not present

## 2021-11-22 DIAGNOSIS — Z7901 Long term (current) use of anticoagulants: Secondary | ICD-10-CM | POA: Diagnosis not present

## 2021-11-22 DIAGNOSIS — I129 Hypertensive chronic kidney disease with stage 1 through stage 4 chronic kidney disease, or unspecified chronic kidney disease: Secondary | ICD-10-CM | POA: Diagnosis not present

## 2021-11-22 DIAGNOSIS — H9193 Unspecified hearing loss, bilateral: Secondary | ICD-10-CM | POA: Diagnosis not present

## 2021-11-22 DIAGNOSIS — E1122 Type 2 diabetes mellitus with diabetic chronic kidney disease: Secondary | ICD-10-CM | POA: Diagnosis not present

## 2021-11-22 DIAGNOSIS — Z9181 History of falling: Secondary | ICD-10-CM | POA: Diagnosis not present

## 2021-11-22 DIAGNOSIS — K219 Gastro-esophageal reflux disease without esophagitis: Secondary | ICD-10-CM | POA: Diagnosis not present

## 2021-11-23 DIAGNOSIS — I129 Hypertensive chronic kidney disease with stage 1 through stage 4 chronic kidney disease, or unspecified chronic kidney disease: Secondary | ICD-10-CM | POA: Diagnosis not present

## 2021-11-23 DIAGNOSIS — N183 Chronic kidney disease, stage 3 unspecified: Secondary | ICD-10-CM | POA: Diagnosis not present

## 2021-11-23 DIAGNOSIS — Z9181 History of falling: Secondary | ICD-10-CM | POA: Diagnosis not present

## 2021-11-23 DIAGNOSIS — Z952 Presence of prosthetic heart valve: Secondary | ICD-10-CM | POA: Diagnosis not present

## 2021-11-23 DIAGNOSIS — I4891 Unspecified atrial fibrillation: Secondary | ICD-10-CM | POA: Diagnosis not present

## 2021-11-23 DIAGNOSIS — H9193 Unspecified hearing loss, bilateral: Secondary | ICD-10-CM | POA: Diagnosis not present

## 2021-11-23 DIAGNOSIS — E1122 Type 2 diabetes mellitus with diabetic chronic kidney disease: Secondary | ICD-10-CM | POA: Diagnosis not present

## 2021-11-23 DIAGNOSIS — Z7901 Long term (current) use of anticoagulants: Secondary | ICD-10-CM | POA: Diagnosis not present

## 2021-11-23 DIAGNOSIS — Z7984 Long term (current) use of oral hypoglycemic drugs: Secondary | ICD-10-CM | POA: Diagnosis not present

## 2021-11-23 DIAGNOSIS — K219 Gastro-esophageal reflux disease without esophagitis: Secondary | ICD-10-CM | POA: Diagnosis not present

## 2021-11-24 DIAGNOSIS — N289 Disorder of kidney and ureter, unspecified: Secondary | ICD-10-CM | POA: Diagnosis not present

## 2021-12-05 DIAGNOSIS — K219 Gastro-esophageal reflux disease without esophagitis: Secondary | ICD-10-CM | POA: Diagnosis not present

## 2021-12-05 DIAGNOSIS — Z7901 Long term (current) use of anticoagulants: Secondary | ICD-10-CM | POA: Diagnosis not present

## 2021-12-05 DIAGNOSIS — I4891 Unspecified atrial fibrillation: Secondary | ICD-10-CM | POA: Diagnosis not present

## 2021-12-05 DIAGNOSIS — Z952 Presence of prosthetic heart valve: Secondary | ICD-10-CM | POA: Diagnosis not present

## 2021-12-05 DIAGNOSIS — N183 Chronic kidney disease, stage 3 unspecified: Secondary | ICD-10-CM | POA: Diagnosis not present

## 2021-12-05 DIAGNOSIS — Z9181 History of falling: Secondary | ICD-10-CM | POA: Diagnosis not present

## 2021-12-05 DIAGNOSIS — I129 Hypertensive chronic kidney disease with stage 1 through stage 4 chronic kidney disease, or unspecified chronic kidney disease: Secondary | ICD-10-CM | POA: Diagnosis not present

## 2021-12-05 DIAGNOSIS — Z7984 Long term (current) use of oral hypoglycemic drugs: Secondary | ICD-10-CM | POA: Diagnosis not present

## 2021-12-05 DIAGNOSIS — H9193 Unspecified hearing loss, bilateral: Secondary | ICD-10-CM | POA: Diagnosis not present

## 2021-12-05 DIAGNOSIS — E1122 Type 2 diabetes mellitus with diabetic chronic kidney disease: Secondary | ICD-10-CM | POA: Diagnosis not present

## 2021-12-08 DIAGNOSIS — Z7901 Long term (current) use of anticoagulants: Secondary | ICD-10-CM | POA: Diagnosis not present

## 2021-12-08 DIAGNOSIS — I129 Hypertensive chronic kidney disease with stage 1 through stage 4 chronic kidney disease, or unspecified chronic kidney disease: Secondary | ICD-10-CM | POA: Diagnosis not present

## 2021-12-08 DIAGNOSIS — Z952 Presence of prosthetic heart valve: Secondary | ICD-10-CM | POA: Diagnosis not present

## 2021-12-08 DIAGNOSIS — Z9181 History of falling: Secondary | ICD-10-CM | POA: Diagnosis not present

## 2021-12-08 DIAGNOSIS — N183 Chronic kidney disease, stage 3 unspecified: Secondary | ICD-10-CM | POA: Diagnosis not present

## 2021-12-08 DIAGNOSIS — H9193 Unspecified hearing loss, bilateral: Secondary | ICD-10-CM | POA: Diagnosis not present

## 2021-12-08 DIAGNOSIS — I4891 Unspecified atrial fibrillation: Secondary | ICD-10-CM | POA: Diagnosis not present

## 2021-12-08 DIAGNOSIS — Z7984 Long term (current) use of oral hypoglycemic drugs: Secondary | ICD-10-CM | POA: Diagnosis not present

## 2021-12-08 DIAGNOSIS — E1122 Type 2 diabetes mellitus with diabetic chronic kidney disease: Secondary | ICD-10-CM | POA: Diagnosis not present

## 2021-12-08 DIAGNOSIS — K219 Gastro-esophageal reflux disease without esophagitis: Secondary | ICD-10-CM | POA: Diagnosis not present

## 2021-12-13 DIAGNOSIS — Z952 Presence of prosthetic heart valve: Secondary | ICD-10-CM | POA: Diagnosis not present

## 2021-12-13 DIAGNOSIS — Z9181 History of falling: Secondary | ICD-10-CM | POA: Diagnosis not present

## 2021-12-13 DIAGNOSIS — Z7984 Long term (current) use of oral hypoglycemic drugs: Secondary | ICD-10-CM | POA: Diagnosis not present

## 2021-12-13 DIAGNOSIS — K219 Gastro-esophageal reflux disease without esophagitis: Secondary | ICD-10-CM | POA: Diagnosis not present

## 2021-12-13 DIAGNOSIS — E1122 Type 2 diabetes mellitus with diabetic chronic kidney disease: Secondary | ICD-10-CM | POA: Diagnosis not present

## 2021-12-13 DIAGNOSIS — I129 Hypertensive chronic kidney disease with stage 1 through stage 4 chronic kidney disease, or unspecified chronic kidney disease: Secondary | ICD-10-CM | POA: Diagnosis not present

## 2021-12-13 DIAGNOSIS — N183 Chronic kidney disease, stage 3 unspecified: Secondary | ICD-10-CM | POA: Diagnosis not present

## 2021-12-13 DIAGNOSIS — H9193 Unspecified hearing loss, bilateral: Secondary | ICD-10-CM | POA: Diagnosis not present

## 2021-12-13 DIAGNOSIS — Z7901 Long term (current) use of anticoagulants: Secondary | ICD-10-CM | POA: Diagnosis not present

## 2021-12-13 DIAGNOSIS — I4891 Unspecified atrial fibrillation: Secondary | ICD-10-CM | POA: Diagnosis not present

## 2021-12-14 DIAGNOSIS — Z9181 History of falling: Secondary | ICD-10-CM | POA: Diagnosis not present

## 2021-12-14 DIAGNOSIS — K219 Gastro-esophageal reflux disease without esophagitis: Secondary | ICD-10-CM | POA: Diagnosis not present

## 2021-12-14 DIAGNOSIS — H9193 Unspecified hearing loss, bilateral: Secondary | ICD-10-CM | POA: Diagnosis not present

## 2021-12-14 DIAGNOSIS — I129 Hypertensive chronic kidney disease with stage 1 through stage 4 chronic kidney disease, or unspecified chronic kidney disease: Secondary | ICD-10-CM | POA: Diagnosis not present

## 2021-12-14 DIAGNOSIS — Z7901 Long term (current) use of anticoagulants: Secondary | ICD-10-CM | POA: Diagnosis not present

## 2021-12-14 DIAGNOSIS — Z7984 Long term (current) use of oral hypoglycemic drugs: Secondary | ICD-10-CM | POA: Diagnosis not present

## 2021-12-14 DIAGNOSIS — N183 Chronic kidney disease, stage 3 unspecified: Secondary | ICD-10-CM | POA: Diagnosis not present

## 2021-12-14 DIAGNOSIS — Z952 Presence of prosthetic heart valve: Secondary | ICD-10-CM | POA: Diagnosis not present

## 2021-12-14 DIAGNOSIS — E1122 Type 2 diabetes mellitus with diabetic chronic kidney disease: Secondary | ICD-10-CM | POA: Diagnosis not present

## 2021-12-14 DIAGNOSIS — I4891 Unspecified atrial fibrillation: Secondary | ICD-10-CM | POA: Diagnosis not present

## 2022-02-06 DIAGNOSIS — H35373 Puckering of macula, bilateral: Secondary | ICD-10-CM | POA: Diagnosis not present

## 2022-03-06 ENCOUNTER — Telehealth: Payer: Self-pay | Admitting: Cardiology

## 2022-03-06 NOTE — Telephone Encounter (Signed)
Pt c/o medication issue:  1. Name of Medication:    metoprolol succinate (TOPROL-XL) 100 MG 24 hr tablet      2. How are you currently taking this medication (dosage and times per day)?   3. Are you having a reaction (difficulty breathing--STAT)? No  4. What is your medication issue? Daughter states that medications are causing pt to be dizzy. Daughter would like to know if adjustments can be made. Please advise

## 2022-03-06 NOTE — Telephone Encounter (Signed)
Called patient's daughter back about message. Patient has been complaining of dizziness at times with her medication and after a pharmacist questioned her dosages, she decided to call. Since patient's BP and HR are fairly number, encouraged patient to take BP medications at different times of the day, instead of all at once. Patient has a recall in for 1 year with Dr. Radford Pax. Scheduled patient next available (end of February). Patient is currently staying at her daughter's house in [place], and she will be back at the first of January, so made her an appointment to see NP at that time. Will forward to Dr. Radford Pax for further advisement.

## 2022-03-06 NOTE — Telephone Encounter (Signed)
Patient has no actual readings of BP, she just said they were normal and vital signs were normal at last visit. I advised for her to take her BP and HR when she is feeling dizzy or having any symptoms, to see if that is what is causing her dizziness.  I also asked her about her blood glucose levels, which she said are normal.

## 2022-04-15 NOTE — Progress Notes (Unsigned)
Cardiology Office Note:    Date:  04/17/2022   ID:  Maria Collier, DOB 1933-06-02, MRN [id number]  PCP:  Kelton Pillar, MD   [facility] Providers Cardiologist:  Fransico Him, MD     Referring MD: Kelton Pillar, MD   Chief Complaint: follow-up valve disease, atrial fibrillation  History of Present Illness:    Maria Collier is a very pleasant 89 y.o. 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   Established care with Dr. Radford Pax for evaluation of chest pain and PVCs.  She had nuclear stress test for PVCs and shortness of breath in 2012 that revealed no ischemia.  Echo revealed normal LVEF with mild MR.  Seen by Dr. Radford Pax 12/26/2020 with c/p pain in her left side, unable to sleep on that side for approximately 2 months described as sharp.  History of lumpectomy and XRT on left breast.  She underwent echocardiogram 04/24/2021 which revealed normal LVEF 60 to 65%, mild LVH, grade 3 diastolic dysfunction, elevated left atrial pressure, moderately elevated pulmonary artery systolic pressure, severely enlarged LA, severe MR, moderate to severe TR. she was referred to structural heart clinic further evaluation.  Seen by Dr. Ali Lowe 05/10/21 for structural heart evaluation. She reported increasing shortness of breath over the previous month and echocardiogram was performed due to detection of murmur. Endorsed orthopnea when laying on left side, LE edema, and decline in functional capacity.  Outpatient TEE and R/LHC were planned for 06/06/2021.  On 2/22 she presented to ED for complaints of dizziness and chest pain.  In ED she developed AF with RVR with rates 140s to 160s treated with Cardizem that was transition to amiodarone and converted to NSR.  She underwent [facility] 06/01/21 which revealed normal coronary arteries and normal heart  pressures.  Seen in follow-up 06/14/2021 by Ambrose Pancoast, NP.  She reported worsening shortness of breath since TEE on 2/28.  She was maintaining sinus rhythm on amiodarone.  Advised to take Lasix 20 mg for the next 3 days and call back if symptoms do not improved. Additional medical therapy was continued with plan to follow-up with structural heart team in 4-6 weeks.   Referred to [facility] cardiology for MR/TR evaluation. She underwent mitral clip 08/15/2021 with echo 09/18/21 with LVEF 59%, well seated clip with mild residual MR, moderate TR, mild to moderate PI, PAP 35 mmHg.   Today, she is here for follow-up. Is accompanied by sign language interpreter. Reports she is feeling well. Some medication adjustments were made and since that time she has felt much better. Gets winded with walking fast, or bending forward to pick up things. Is walking in her hallway for exercise. Occasionally feels like balance gets off with long walks. Will need to use the wall for balance. No presyncope, syncope. Sometimes feels dizzy when standing up too fast. No change in activity tolerance recently. Has maintained a schedule of working for a bit then resting for some time. Continues to have tenderness on left side, instructed not to sleep on that side. Home BP 616 mmHg systolic and diastolic < 07-37T.  Went to dentist recently, was not aware to take antibiotics prior to dental work.   Past Medical History:  Diagnosis Date   Deafness 1937   following meningitis   Diabetes mellitus without complication (HCC)    Dyspnea    at night if the house  is hot   Family history of breast cancer    Family history of colonic polyps    Family history of ovarian cancer    GERD (gastroesophageal reflux disease)    occasionally   Headache    sometimes   Hyperlipidemia    Hypertension    Meningitis    Mitral regurgitation    severe by echo 04/2021   Osteoporosis    Pneumonia    yrs. ago   Pulmonary HTN ([place])    PASP in the 50's  by echo 04/2021   Pulmonic regurgitation    moderate by echo 04/2021   Tricuspid regurgitation    moderate to severe by echo 04/2021    Past Surgical History:  Procedure Laterality Date   ABDOMINAL HYSTERECTOMY     BREAST LUMPECTOMY WITH RADIOACTIVE SEED LOCALIZATION Left 05/15/2017   Procedure: BREAST LUMPECTOMY WITH RADIOACTIVE SEED LOCALIZATION;  Surgeon: Fanny Skates, MD;  Location: [place];  Service: General;  Laterality: Left;   RIGHT/LEFT HEART CATH AND CORONARY ANGIOGRAPHY N/A 06/01/2021   Procedure: RIGHT/LEFT HEART CATH AND CORONARY ANGIOGRAPHY;  Surgeon: Martinique, Peter M, MD;  Location: [place] [facility];  Service: Cardiovascular;  Laterality: N/A;   TEE WITHOUT CARDIOVERSION N/A 06/06/2021   Procedure: TRANSESOPHAGEAL ECHOCARDIOGRAM (TEE);  Surgeon: Sanda Klein, MD;  Location: [facility];  Service: Cardiovascular;  Laterality: N/A;    Current Medications: Current Meds  Medication Sig   ACCU-CHEK GUIDE test strip 1 STRIP FINGER STICK ONCE A DAY NEED PART B   acetaminophen (TYLENOL) 500 MG tablet Take 500 mg by mouth daily as needed for mild pain or headache.   amiodarone (PACERONE) 200 MG tablet Take 1 tablet (200 mg total) by mouth daily.   amLODipine (NORVASC) 5 MG tablet Take 5 mg by mouth daily.   amoxicillin (AMOXIL) 500 MG tablet Take 4 tablets (2,000 mg total) by mouth as directed. 30-60 minutes prior to dental procedure.   apixaban (ELIQUIS) 2.5 MG TABS tablet Take 1 tablet (2.5 mg total) by mouth 2 (two) times daily.   atorvastatin (LIPITOR) 20 MG tablet Take 1 tablet (20 mg total) by mouth daily.   Blood Glucose Monitoring Suppl (ACCU-CHEK GUIDE) w/Device KIT USE FOR BLOOD SUGAR CHECK FINGER STICK ONCE A DAY   Carboxymethylcellul-Glycerin (REFRESH RELIEVA PF OP) Place 1 drop into both eyes 4 (four) times daily.   Cholecalciferol (VITAMIN D3 PO) Take 1 tablet by mouth See admin instructions. Chew 1 gummy by mouth twice weekly - Tuesday and Thursday   Cyanocobalamin  (VITAMIN B12 TR PO) Take 1 tablet by mouth daily. OTC gummy   Ferrous Sulfate (IRON PO) Take 1 tablet by mouth daily. OTC gummy   FOLIC ACID PO Take 1 tablet by mouth daily. OTC gummy   hydrochlorothiazide (HYDRODIURIL) 25 MG tablet Take 25 mg by mouth every morning.   Multiple Vitamins-Minerals (ADULT ONE DAILY GUMMIES) CHEW Chew 1 tablet by mouth daily.   Omega-3 Fatty Acids (FISH OIL PO) Take 2 tablets by mouth daily. OTC gummy   TRADJENTA 5 MG TABS tablet Take 5 mg by mouth daily.   vitamin C (ASCORBIC ACID) 500 MG tablet Take 500 mg by mouth daily.   VITAMIN E PO Take 1 tablet by mouth daily. OTC gummy     Allergies:   Altace [ramipril], Hyzaar [losartan potassium-hctz], Isosorbide, and Hydrocodone   Social History   Socioeconomic History   Marital status: Married    Spouse name: Not on file   Number of children: Not  on file   Years of education: Not on file   Highest education level: Not on file  Occupational History   Not on file  Tobacco Use   Smoking status: Never   Smokeless tobacco: Never  Vaping Use   Vaping Use: Never used  Substance and Sexual Activity   Alcohol use: No   Drug use: No   Sexual activity: Not Currently  Other Topics Concern   Not on file  Social History Narrative   Not on file   Social Determinants of Health   Financial Resource Strain: Not on file  Food Insecurity: Not on file  Transportation Needs: No Transportation Needs (05/05/2021)   PRAPARE - Transportation    Lack of Transportation (Medical): No    Lack of Transportation (Non-Medical): No  Physical Activity: Not on file  Stress: Not on file  Social Connections: Not on file     Family History: The patient's family history includes Breast cancer (age of onset: 59) in her daughter; Cancer in her paternal grandfather and paternal grandmother; Colon polyps in her daughter; Heart disease in her father; Hypertension in her brother; Other in her sister; Ovarian cancer in her  daughter.  ROS:   Please see the history of present illness.   + Mild LE Edema + chronic DOE All other systems reviewed and are negative.  Labs/Other Studies Reviewed:    The following studies were reviewed today:  2D Echo 09/18/21 @ [facility] LVEF 59%, mild LVH Indeterminate diastolic parameters, normal RV size and function Severe enlargement LA S/p mitral clip NTG x 2 with mild mitral regurgitation, peak MV velocity is 1.3 ms mean gradient is 2 mmHg Moderate TR Mild to moderate PR PASP 35 mmHg, no pericardial effusion   Echo TEE 06/06/21 1. Left ventricular ejection fraction, by estimation, is 60 to 65%. The  left ventricle has normal function. The left ventricle has no regional  wall motion abnormalities. Left ventricular diastolic parameters are  consistent with Grade II diastolic  dysfunction (pseudonormalization). Elevated left atrial pressure.   2. Right ventricular systolic function is normal. The right ventricular  size is mildly enlarged. There is severely elevated pulmonary artery  systolic pressure.   3. Left atrial size was severely dilated. No left atrial/left atrial  appendage thrombus was detected.   4. Right atrial size was severely dilated.   5. Ruptured chorda tendina to the middle (P2) scallop of the posterior  mitral leaflet, with flail motion of the P2 scallop. The anterior leaflet  is mildly calcified and moderately thickened (4.5 mm at the tip of the A2  segment, opposed to flail  scallop). The posterior leaflet is of sufficient length (11 mm). The flail  gap is relatively narrow (3.5 mm). MR vena contracta 4 mm. Calculated ERO  area based on PISA is 0.28 cm sq, regurgitant volume 50 ml, regurgitant  fraction 58%. The MV area is 4 cm   sq (PHT method), 4.25 cm sq (planimetry). The mitral valve is myxomatous.  Severe mitral valve regurgitation. No evidence of mitral stenosis. The  mean mitral valve gradient is 2.4 mmHg with average heart rate of 67 bpm.   Moderate mitral annular  calcification.   6. The tricuspid valve is myxomatous. Tricuspid valve regurgitation is  severe.   7. The aortic valve is tricuspid. There is mild calcification of the  aortic valve. There is mild thickening of the aortic valve. Aortic valve  regurgitation is mild. No aortic stenosis is present.   8. There  is Moderate (Grade III) protruding plaque involving the  descending aorta.  R/LHC 3/42/87  LV end diastolic pressure is normal.   Normal coronary anatomy Normal LV filling pressures PCWP 7 mm Hg mean Normal right heart pressures PAP mean 17 mm Hg Normal cardiac output. Index 3.03.    Echo 04/24/21 1. Left ventricular ejection fraction, by estimation, is 60 to 65%. The  left ventricle has normal function. The left ventricle has no regional  wall motion abnormalities. There is mild left ventricular hypertrophy.  Left ventricular diastolic parameters  are consistent with Grade III diastolic dysfunction (restrictive).  Elevated left atrial pressure.   2. Right ventricular systolic function is normal. The right ventricular  size is mildly enlarged. There is moderately elevated pulmonary artery  systolic pressure. The estimated right ventricular systolic pressure is  68.1 mmHg.   3. Left atrial size was severely dilated.   4. Right atrial size was mildly dilated.   5. The mitral valve is degenerative. Severe mitral valve regurgitation.  Severe mitral annular calcification.   6. Pulmonic valve regurgitation is moderate.   7. The tricuspid valve is abnormal. Tricuspid valve regurgitation is  moderate to severe.   8. The aortic valve is tricuspid. Aortic valve regurgitation is trivial.  Aortic valve sclerosis is present, with no evidence of aortic valve  stenosis.   9. The inferior vena cava is normal in size with greater than 50%  respiratory variability, suggesting right atrial pressure of 3 mmHg.  Lexiscan Myoview 08/18/2014 Low risk pharmacological  stress nuclear study with normal perfusion and normal left ventricular regional and global systolic function.  Recent Labs: 05/31/2021: B Natriuretic Peptide 597.6; TSH 1.540 06/02/2021: ALT 15; BUN 26; Creatinine, Ser 1.16; Hemoglobin 10.5; Magnesium 1.9; Platelets 148; Potassium 4.6; Sodium 138  Recent Lipid Panel No results found for: "CHOL", "TRIG", "HDL", "CHOLHDL", "VLDL", "LDLCALC", "LDLDIRECT"   Risk Assessment/Calculations:    CHA2DS2-VASc Score = 6  This indicates a 9.7% annual risk of stroke. The patient's score is based upon: CHF History: 1 HTN History: 1 Diabetes History: 1 Stroke History: 0 Vascular Disease History: 0 Age Score: 2 Gender Score: 1    Physical Exam:    VS:  BP (!) 144/60   Pulse 60   Ht '5\' 2"'$  (1.575 m)   Wt 123 lb (55.8 kg)   SpO2 99%   BMI 22.50 kg/m     Wt Readings from Last 3 Encounters:  04/17/22 123 lb (55.8 kg)  06/14/21 125 lb 12.8 oz (57.1 kg)  06/06/21 126 lb (57.2 kg)     GEN:  Well nourished, well developed in no acute distress HEENT: Normal NECK: No JVD; No carotid bruits CARDIAC: RRR, 2/6 holosystolic murmur. No rubs, gallops RESPIRATORY:  Clear to auscultation without rales, wheezing or rhonchi  ABDOMEN: Soft, non-tender, non-distended MUSCULOSKELETAL:  No edema; No deformity. 2+ pedal pulses, equal bilaterally SKIN: Warm and dry NEUROLOGIC:  Alert and oriented x 3 PSYCHIATRIC:  Normal affect   EKG:  EKG is ordered today.  The ekg ordered today demonstrates normal sinus rhythm at 60 bpm, LAFB, no acute change from previous  HYPERTENSION CONTROL Vitals:   04/17/22 0801 04/17/22 0844  BP: (!) 150/74 (!) 144/60    The patient's blood pressure is elevated above target today.  In order to address the patient's elevated BP: The blood pressure is usually elevated in clinic.  Blood pressures monitored at home have been optimal.; Blood pressure will be monitored at home to determine if medication  changes need to be made.       Diagnoses:    1. Mitral valve insufficiency, unspecified etiology   2. Primary hypertension   3. Atrial fibrillation with RVR ([place])   4. Chronic diastolic CHF (congestive heart failure) ([place])   5. Tricuspid valve insufficiency, unspecified etiology   6. Chronic anticoagulation   7. Long term current use of antiarrhythmic drug   8. Need for SBE (subacute bacterial endocarditis) prophylaxis    Assessment and Plan:     Valve disease: Severe MR, moderate to severe TR s/p MR clip 08/15/21.  Echo at [facility] 09/18/2021 revealed improved mitral valve gradient and peak mitral valve velocity, improvement in TR from moderate/severe to moderate, and improved PASP. Reports chronic dyspnea is stable.  No palpitations, chest pain, presyncope, syncope.  She is tolerating activities without concerning symptoms. Will continue to monitor clinically at this time.   Chronic HFpEF: LVEF 59%, unable to determine diastolic parameters on echo 09/18/21. Feels that chronic dyspnea is stable. Very mild LE edema, non-pitting. No orthopnea, PND. Appears euvolemic on exam today. Feeling well on current medications, does not want to make any changes. Continue Lasix.  PAF on chroic anticoagulation: Maintaining sinus rhythm on EKG today. HR well-controlled. Asymptomatic. Continue Amiodarone 200 mg daily, Eliquis 2.5 mg twice daily (appropriate dose for age/weight. Cost of Eliquis is concerning, will provide paperwork for patient assistance as well as samples today.  High risk medication monitoring: Maintaining sinus rhythm on amiodarone 200 mg daily.  No concerning symptoms.  She maintains regular eye exams. CXR 05/31/21 without acute abnormality.  Will check TSH, LFTs today.   SBE prophylaxis: Recommended s/p mitral clip per notes from structural heart team at [facility]. Recent dental visit, did not take antibiotics. Will send amoxicillin 2000 mg to take 30 to 60 minutes prior to dental procedures in the future.  Hypertension:  BP initially elevated and remains mildly elevated on my recheck. She reports well-controlled BP at home. Has recently d/ced metoprolol. Advised her to continue to monitor and report consistently elevated readings.  Continue amlodipine, hydrochlorothiazide. Will check BMP today.      Disposition: 6  months with Dr. Radford Pax  Medication Adjustments/Labs and Tests Ordered: Current medicines are reviewed at length with the patient today.  Concerns regarding medicines are outlined above.  Orders Placed This Encounter  Procedures   TSH   Lipid Profile   Comp Met (CMET)   EKG 12-Lead   Meds ordered this encounter  Medications   amiodarone (PACERONE) 200 MG tablet    Sig: Take 1 tablet (200 mg total) by mouth daily.    Dispense:  90 tablet    Refill:  3   amoxicillin (AMOXIL) 500 MG tablet    Sig: Take 4 tablets (2,000 mg total) by mouth as directed. 30-60 minutes prior to dental procedure.    Dispense:  4 tablet    Refill:  2    Patient Instructions  Medication Instructions:   START Amoxicillin four (4) tablets by mouth ( 2000 mg) 30-60 minutes prior to dental procedure.   *If you need a refill on your cardiac medications before your next appointment, please call your pharmacy*   Lab Work:  TODAY!!!! TSH/LIPID/CMET  If you have labs (blood work) drawn today and your tests are completely normal, you will receive your results only by: [place] (if you have MyChart) OR A paper copy in the mail If you have any lab test that is abnormal or we need to change your treatment, we will call you to review the results.   Testing/Procedures:  None ordered.    Follow-Up: At [facility], you and your health needs are our priority.  As part of our continuing mission to provide you with exceptional heart care, we have created designated Provider Care Teams.   These Care Teams include your primary Cardiologist (physician) and Advanced Practice Providers (APPs -  Physician Assistants and Nurse Practitioners) who all work together to provide you with the care you need, when you need it.  We recommend signing up for the patient portal called "MyChart".  Sign up information is provided on this After Visit Summary.  MyChart is used to connect with patients for Virtual Visits (Telemedicine).  Patients are able to view lab/test results, encounter notes, upcoming appointments, etc.  Non-urgent messages can be sent to your provider as well.   To learn more about what you can do with MyChart, go to [email or web address].    Your next appointment:   6 month(s)  The format for your next appointment:   In Person  Provider:   Fransico Him, MD     Other Instructions  [place] 5 minutes before taking your blood pressure. Don't  smoke or drink caffeinated beverages for at least 30 minutes before. Take your blood pressure before (not after) you eat. Sit comfortably with your back supported and both feet on the floor ( don't cross your legs). Elevate your arm to heart level on a table or a desk. Use the proper sized cuff.  It should fit smoothly and snugly around your bare upper arm.  There should be  Enough room to slip a fingertip under the cuff.  The bottom edge of the cuff should be 1 inch above the crease Of the elbow. Please monitor your blood pressure once daily 2 hours after your am medication. If you blood pressure Consistently remains above 193 (systolic) top number or over 90 ( diastolic) bottom number X 3 days  Consecutively.  Please call our office at [phone] or send Mychart message.     ----Avoid cold medicines with D or DM at the end of them----     Important Information About Sugar         Signed, Emmaline Life, NP  04/17/2022 9:20 AM    [place]

## 2022-04-16 DIAGNOSIS — I700 Atherosclerosis of aorta: Secondary | ICD-10-CM | POA: Diagnosis not present

## 2022-04-16 DIAGNOSIS — I5032 Chronic diastolic (congestive) heart failure: Secondary | ICD-10-CM | POA: Diagnosis not present

## 2022-04-16 DIAGNOSIS — K219 Gastro-esophageal reflux disease without esophagitis: Secondary | ICD-10-CM | POA: Diagnosis not present

## 2022-04-16 DIAGNOSIS — N183 Chronic kidney disease, stage 3 unspecified: Secondary | ICD-10-CM | POA: Diagnosis not present

## 2022-04-16 DIAGNOSIS — E1121 Type 2 diabetes mellitus with diabetic nephropathy: Secondary | ICD-10-CM | POA: Diagnosis not present

## 2022-04-16 DIAGNOSIS — I499 Cardiac arrhythmia, unspecified: Secondary | ICD-10-CM | POA: Diagnosis not present

## 2022-04-16 DIAGNOSIS — D0510 Intraductal carcinoma in situ of unspecified breast: Secondary | ICD-10-CM | POA: Diagnosis not present

## 2022-04-16 DIAGNOSIS — E1140 Type 2 diabetes mellitus with diabetic neuropathy, unspecified: Secondary | ICD-10-CM | POA: Diagnosis not present

## 2022-04-16 DIAGNOSIS — I129 Hypertensive chronic kidney disease with stage 1 through stage 4 chronic kidney disease, or unspecified chronic kidney disease: Secondary | ICD-10-CM | POA: Diagnosis not present

## 2022-04-16 DIAGNOSIS — E7800 Pure hypercholesterolemia, unspecified: Secondary | ICD-10-CM | POA: Diagnosis not present

## 2022-04-16 DIAGNOSIS — Z0000 Encounter for general adult medical examination without abnormal findings: Secondary | ICD-10-CM | POA: Diagnosis not present

## 2022-04-16 DIAGNOSIS — E559 Vitamin D deficiency, unspecified: Secondary | ICD-10-CM | POA: Diagnosis not present

## 2022-04-17 ENCOUNTER — Encounter: Payer: Self-pay | Admitting: Nurse Practitioner

## 2022-04-17 ENCOUNTER — Ambulatory Visit: Payer: Medicare HMO | Attending: Nurse Practitioner | Admitting: Nurse Practitioner

## 2022-04-17 VITALS — BP 144/60 | HR 60 | Ht 62.0 in | Wt 123.0 lb

## 2022-04-17 DIAGNOSIS — Z79899 Other long term (current) drug therapy: Secondary | ICD-10-CM | POA: Diagnosis not present

## 2022-04-17 DIAGNOSIS — I340 Nonrheumatic mitral (valve) insufficiency: Secondary | ICD-10-CM | POA: Diagnosis not present

## 2022-04-17 DIAGNOSIS — Z2989 Encounter for other specified prophylactic measures: Secondary | ICD-10-CM | POA: Diagnosis not present

## 2022-04-17 DIAGNOSIS — Z7901 Long term (current) use of anticoagulants: Secondary | ICD-10-CM | POA: Diagnosis not present

## 2022-04-17 DIAGNOSIS — I5032 Chronic diastolic (congestive) heart failure: Secondary | ICD-10-CM

## 2022-04-17 DIAGNOSIS — I10 Essential (primary) hypertension: Secondary | ICD-10-CM | POA: Diagnosis not present

## 2022-04-17 DIAGNOSIS — I4891 Unspecified atrial fibrillation: Secondary | ICD-10-CM | POA: Diagnosis not present

## 2022-04-17 DIAGNOSIS — I071 Rheumatic tricuspid insufficiency: Secondary | ICD-10-CM | POA: Diagnosis not present

## 2022-04-17 MED ORDER — AMOXICILLIN 500 MG PO TABS: 2000.0000 mg | ORAL_TABLET | ORAL | 2 refills | Status: AC

## 2022-04-17 MED ORDER — AMIODARONE HCL 200 MG PO TABS: 200.0000 mg | ORAL_TABLET | Freq: Every day | ORAL | 3 refills | Status: AC

## 2022-04-17 NOTE — Patient Instructions (Signed)
Medication Instructions:   START Amoxicillin four (4) tablets by mouth ( 2000 mg) 30-60 minutes prior to dental procedure.   *If you need a refill on your cardiac medications before your next appointment, please call your pharmacy*   Lab Work:  TODAY!!!! TSH/LIPID/CMET                                                                                                                          If you have labs (blood work) drawn today and your tests are completely normal, you will receive your results only by: [place] (if you have MyChart) OR A paper copy in the mail If you have any lab test that is abnormal or we need to change your treatment, we will call you to review the results.   Testing/Procedures:  None ordered.    Follow-Up: At [facility], you and your health needs are our priority.  As part of our continuing mission to provide you with exceptional heart care, we have created designated Provider Care Teams.  These Care Teams include your primary Cardiologist (physician) and Advanced Practice Providers (APPs -  Physician Assistants and Nurse Practitioners) who all work together to provide you with the care you need, when you need it.  We recommend signing up for the patient portal called "MyChart".  Sign up information is provided on this After Visit Summary.  MyChart is used to connect with patients for Virtual Visits (Telemedicine).  Patients are able to view lab/test results, encounter notes, upcoming appointments, etc.  Non-urgent messages can be sent to your provider as well.   To learn more about what you can do with MyChart, go to [email or web address].    Your next appointment:   6 month(s)  The format for your next appointment:   In Person  Provider:   Fransico Him, MD     Other Instructions  [place] 5 minutes before taking your blood pressure. Don't  smoke or drink caffeinated beverages for at least 30 minutes  before. Take your blood pressure before (not after) you eat. Sit comfortably with your back supported and both feet on the floor ( don't cross your legs). Elevate your arm to heart level on a table or a desk. Use the proper sized cuff.  It should fit smoothly and snugly around your bare upper arm.  There should be  Enough room to slip a fingertip under the cuff.  The bottom edge of the cuff should be 1 inch above the crease Of the elbow. Please monitor your blood pressure once daily 2 hours after your am medication. If you blood pressure Consistently remains above 654 (systolic) top number or over 90 ( diastolic) bottom number X 3 days  Consecutively.  Please call our office at [phone] or send Mychart message.     ----Avoid cold medicines with D or DM at the end of them----     Important Information About Sugar

## 2022-04-20 ENCOUNTER — Telehealth: Payer: Self-pay | Admitting: Nurse Practitioner

## 2022-04-20 DIAGNOSIS — Z79899 Other long term (current) drug therapy: Secondary | ICD-10-CM

## 2022-04-20 LAB — LIPID PANEL
Chol/HDL Ratio: 2.1 ratio (ref 0.0–4.4)
Cholesterol, Total: 181 mg/dL (ref 100–199)
HDL: 87 mg/dL (ref >39)
LDL Chol Calc (NIH): 79 mg/dL (ref 0–99)
Triglycerides: 80 mg/dL (ref 0–149)
VLDL Cholesterol Cal: 15 mg/dL (ref 5–40)

## 2022-04-20 LAB — COMPREHENSIVE METABOLIC PANEL
ALT: 26 IU/L (ref 0–32)
AST: 24 IU/L (ref 0–40)
Albumin/Globulin Ratio: 1.8 (ref 1.2–2.2)
Albumin: 4.4 g/dL (ref 3.7–4.7)
Alkaline Phosphatase: 45 IU/L (ref 44–121)
BUN/Creatinine Ratio: 19 (ref 12–28)
BUN: 32 mg/dL — ABNORMAL HIGH (ref 8–27)
Bilirubin Total: 0.3 mg/dL (ref 0.0–1.2)
CO2: 29 mmol/L (ref 20–29)
Calcium: 9.8 mg/dL (ref 8.7–10.3)
Chloride: 100 mmol/L (ref 96–106)
Creatinine, Ser: 1.72 mg/dL — ABNORMAL HIGH (ref 0.57–1.00)
Globulin, Total: 2.5 g/dL (ref 1.5–4.5)
Glucose: 127 mg/dL — ABNORMAL HIGH (ref 70–99)
Potassium: 4.6 mmol/L (ref 3.5–5.2)
Sodium: 144 mmol/L (ref 134–144)
Total Protein: 6.9 g/dL (ref 6.0–8.5)
eGFR: 29 mL/min/{1.73_m2} — ABNORMAL LOW (ref >59)

## 2022-04-20 LAB — TSH: TSH: 4.33 u[IU]/mL (ref 0.450–4.500)

## 2022-04-20 MED ORDER — FUROSEMIDE 20 MG PO TABS: 20.0000 mg | ORAL_TABLET | ORAL | 3 refills | Status: DC

## 2022-04-20 NOTE — Telephone Encounter (Signed)
Per DPR, left a detailed message on Maria Collier's voicemail, Per DRP, left detailed message to change Lasix to every other day and repeat BMET in 2 weeks, with date of 05/04/22, and TSH was normal.

## 2022-04-20 NOTE — Telephone Encounter (Signed)
-----  Message from Emmaline Life, NP sent at 04/18/2022  6:29 AM EST ----- Creatinine is quite a bit higher than on last check (1.4 in May). Decrease Lasix 20 mg to every other day and continue hydrochlorothiazide daily. Ensure good hydration with 64 ounces total fluids daily, the majority (at least 48 ounces) should be water. We need to recheck bmet in 2 weeks.

## 2022-04-20 NOTE — Telephone Encounter (Signed)
Patient's daughter returning call for lab results. She says it is okay to leave a detailed message with the results.

## 2022-04-27 DIAGNOSIS — Z1231 Encounter for screening mammogram for malignant neoplasm of breast: Secondary | ICD-10-CM | POA: Diagnosis not present

## 2022-04-27 DIAGNOSIS — Z780 Asymptomatic menopausal state: Secondary | ICD-10-CM | POA: Diagnosis not present

## 2022-04-27 DIAGNOSIS — M85851 Other specified disorders of bone density and structure, right thigh: Secondary | ICD-10-CM | POA: Diagnosis not present

## 2022-05-04 ENCOUNTER — Other Ambulatory Visit: Payer: Medicare HMO

## 2022-05-09 DIAGNOSIS — R922 Inconclusive mammogram: Secondary | ICD-10-CM | POA: Diagnosis not present

## 2022-05-09 DIAGNOSIS — R928 Other abnormal and inconclusive findings on diagnostic imaging of breast: Secondary | ICD-10-CM | POA: Diagnosis not present

## 2022-05-28 DIAGNOSIS — H04123 Dry eye syndrome of bilateral lacrimal glands: Secondary | ICD-10-CM | POA: Diagnosis not present

## 2022-05-28 DIAGNOSIS — Z961 Presence of intraocular lens: Secondary | ICD-10-CM | POA: Diagnosis not present

## 2022-05-28 DIAGNOSIS — H35373 Puckering of macula, bilateral: Secondary | ICD-10-CM | POA: Diagnosis not present

## 2022-05-28 DIAGNOSIS — E119 Type 2 diabetes mellitus without complications: Secondary | ICD-10-CM | POA: Diagnosis not present

## 2022-06-06 ENCOUNTER — Ambulatory Visit: Payer: Medicare HMO | Admitting: Cardiology

## 2022-06-21 DIAGNOSIS — E1122 Type 2 diabetes mellitus with diabetic chronic kidney disease: Secondary | ICD-10-CM | POA: Diagnosis not present

## 2022-06-21 DIAGNOSIS — D649 Anemia, unspecified: Secondary | ICD-10-CM | POA: Diagnosis not present

## 2022-06-21 DIAGNOSIS — I129 Hypertensive chronic kidney disease with stage 1 through stage 4 chronic kidney disease, or unspecified chronic kidney disease: Secondary | ICD-10-CM | POA: Diagnosis not present

## 2022-06-21 DIAGNOSIS — N1832 Chronic kidney disease, stage 3b: Secondary | ICD-10-CM | POA: Diagnosis not present

## 2022-06-21 DIAGNOSIS — Z952 Presence of prosthetic heart valve: Secondary | ICD-10-CM | POA: Diagnosis not present

## 2022-06-22 ENCOUNTER — Other Ambulatory Visit: Payer: Self-pay | Admitting: Internal Medicine

## 2022-06-22 DIAGNOSIS — N1832 Chronic kidney disease, stage 3b: Secondary | ICD-10-CM

## 2022-07-01 ENCOUNTER — Other Ambulatory Visit: Payer: Self-pay | Admitting: Internal Medicine

## 2022-07-25 DIAGNOSIS — H52203 Unspecified astigmatism, bilateral: Secondary | ICD-10-CM | POA: Diagnosis not present

## 2022-07-25 DIAGNOSIS — H35371 Puckering of macula, right eye: Secondary | ICD-10-CM | POA: Diagnosis not present

## 2022-07-25 DIAGNOSIS — H01002 Unspecified blepharitis right lower eyelid: Secondary | ICD-10-CM | POA: Diagnosis not present

## 2022-07-25 DIAGNOSIS — H01005 Unspecified blepharitis left lower eyelid: Secondary | ICD-10-CM | POA: Diagnosis not present

## 2022-07-25 DIAGNOSIS — H04123 Dry eye syndrome of bilateral lacrimal glands: Secondary | ICD-10-CM | POA: Diagnosis not present

## 2022-07-25 DIAGNOSIS — Z961 Presence of intraocular lens: Secondary | ICD-10-CM | POA: Diagnosis not present

## 2022-08-13 ENCOUNTER — Ambulatory Visit
Admission: RE | Admit: 2022-08-13 | Discharge: 2022-08-13 | Disposition: A | Discharge status: Home / Self Care | Payer: Medicare HMO | Source: Ambulatory Visit | Attending: Internal Medicine | Admitting: Internal Medicine

## 2022-08-13 DIAGNOSIS — N1832 Chronic kidney disease, stage 3b: Secondary | ICD-10-CM

## 2022-08-13 DIAGNOSIS — N189 Chronic kidney disease, unspecified: Secondary | ICD-10-CM | POA: Diagnosis not present

## 2022-08-14 DIAGNOSIS — I340 Nonrheumatic mitral (valve) insufficiency: Secondary | ICD-10-CM | POA: Diagnosis not present

## 2022-08-16 DIAGNOSIS — I361 Nonrheumatic tricuspid (valve) insufficiency: Secondary | ICD-10-CM | POA: Diagnosis not present

## 2022-08-16 DIAGNOSIS — I480 Paroxysmal atrial fibrillation: Secondary | ICD-10-CM | POA: Diagnosis not present

## 2022-08-16 DIAGNOSIS — I5042 Chronic combined systolic (congestive) and diastolic (congestive) heart failure: Secondary | ICD-10-CM | POA: Diagnosis not present

## 2022-08-16 DIAGNOSIS — Z95818 Presence of other cardiac implants and grafts: Secondary | ICD-10-CM | POA: Diagnosis not present

## 2022-08-16 DIAGNOSIS — I38 Endocarditis, valve unspecified: Secondary | ICD-10-CM | POA: Diagnosis not present

## 2022-08-16 DIAGNOSIS — D6859 Other primary thrombophilia: Secondary | ICD-10-CM | POA: Diagnosis not present

## 2022-08-23 DIAGNOSIS — I361 Nonrheumatic tricuspid (valve) insufficiency: Secondary | ICD-10-CM | POA: Diagnosis not present

## 2022-09-07 DIAGNOSIS — H04123 Dry eye syndrome of bilateral lacrimal glands: Secondary | ICD-10-CM | POA: Diagnosis not present

## 2022-09-08 IMAGING — DX DG CHEST 1V PORT
1 series · 1 of 1 positions shown · non-contrast
Comparison: May 02, 2010

CLINICAL DATA: Chest pain.

EXAM:
PORTABLE CHEST 1 VIEW

[chest ap]
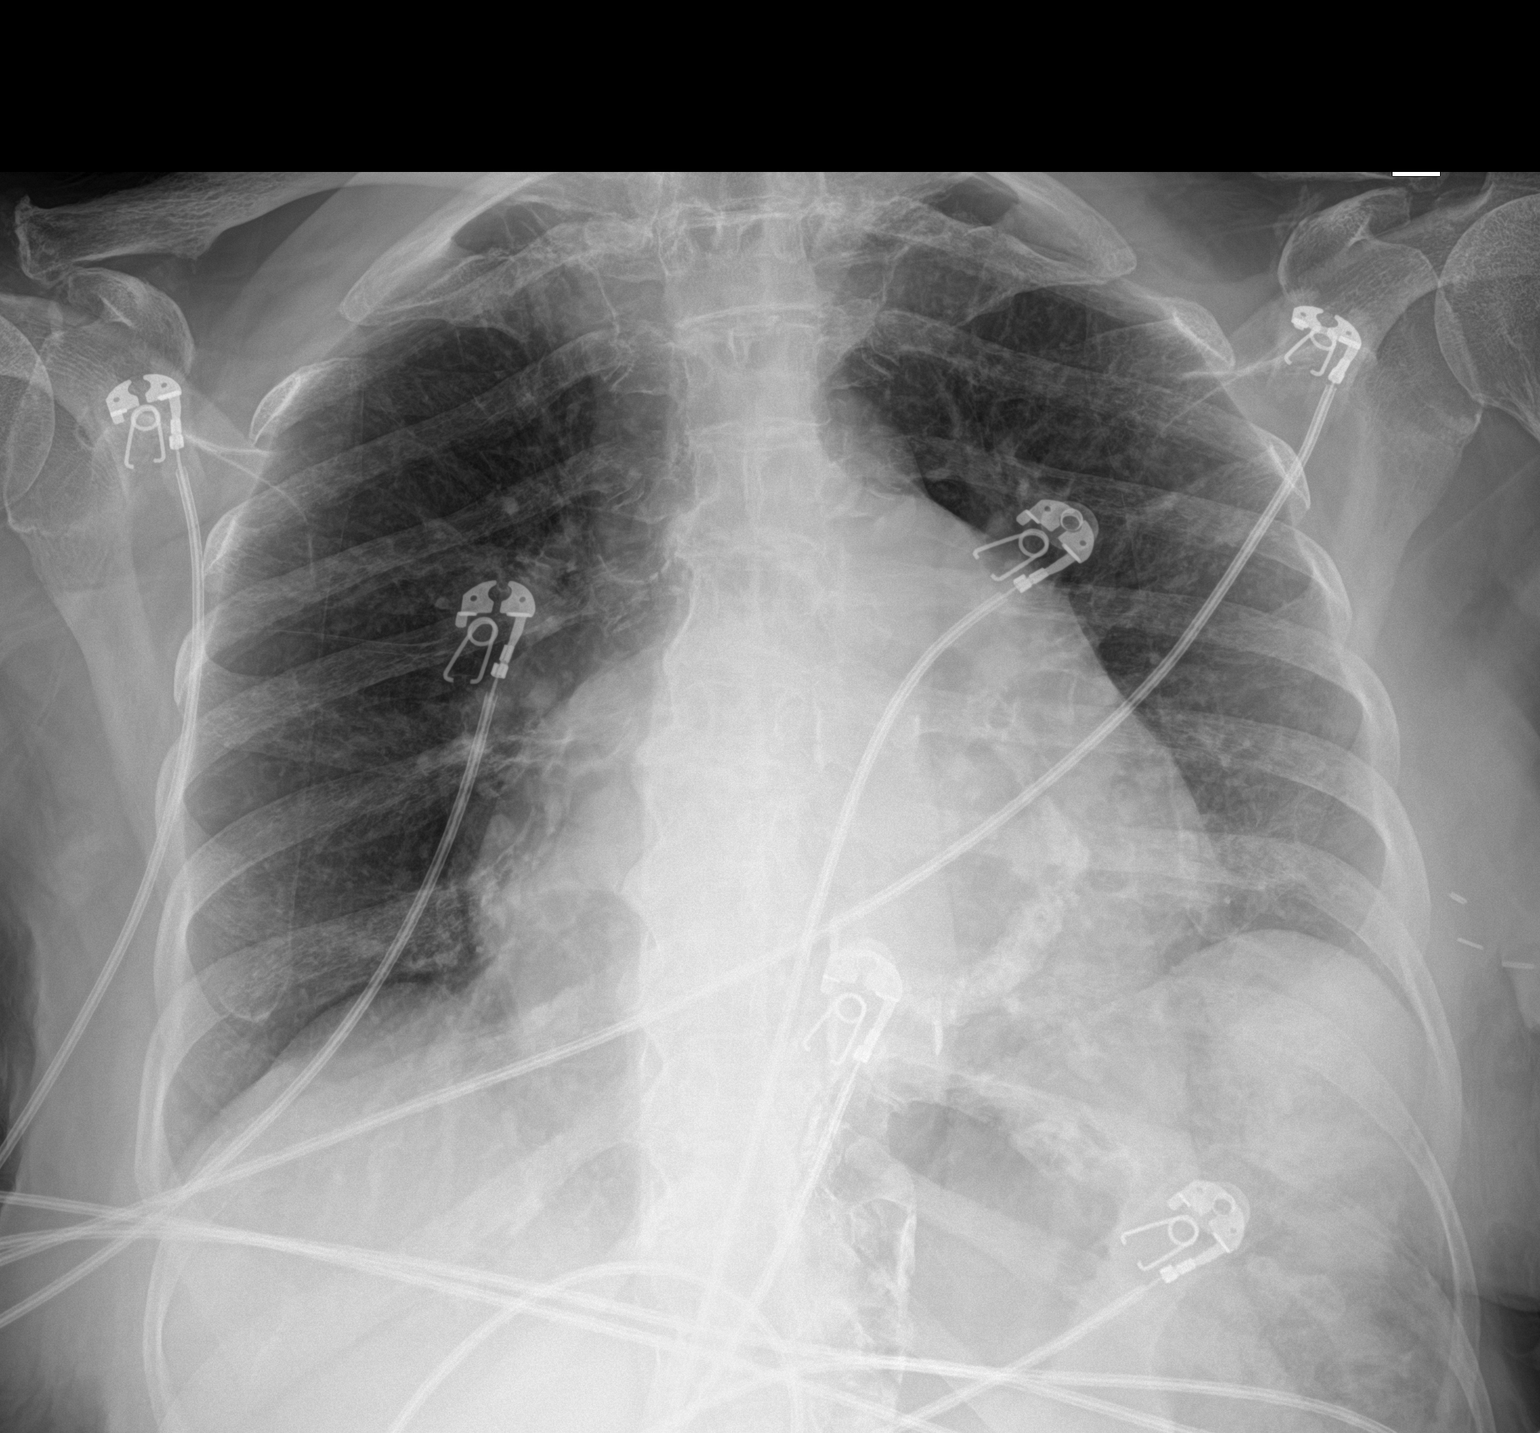

[1 of 1 positions shown; findings below may reference images not displayed]

FINDINGS: There is moderate to marked severity enlargement of the cardiac
silhouette which is increased in severity when compared to the prior
study. Marked severity curvilinear cardiac related calcification is
seen overlying the medial aspect of the left lung base. Mildly
decreased lung volumes are noted. Both lungs are clear. Multilevel
degenerative changes are seen throughout the thoracic spine.
IMPRESSION: Cardiomegaly, as described above, without acute cardiopulmonary
disease.

## 2022-09-21 ENCOUNTER — Ambulatory Visit: Payer: Medicare HMO | Admitting: Cardiology

## 2022-10-05 DIAGNOSIS — H04123 Dry eye syndrome of bilateral lacrimal glands: Secondary | ICD-10-CM | POA: Diagnosis not present

## 2022-11-05 DIAGNOSIS — R0602 Shortness of breath: Secondary | ICD-10-CM | POA: Diagnosis not present

## 2022-11-05 DIAGNOSIS — D649 Anemia, unspecified: Secondary | ICD-10-CM | POA: Diagnosis not present

## 2022-11-05 DIAGNOSIS — D6869 Other thrombophilia: Secondary | ICD-10-CM | POA: Diagnosis not present

## 2022-11-05 DIAGNOSIS — I5032 Chronic diastolic (congestive) heart failure: Secondary | ICD-10-CM | POA: Diagnosis not present

## 2022-11-05 DIAGNOSIS — I130 Hypertensive heart and chronic kidney disease with heart failure and stage 1 through stage 4 chronic kidney disease, or unspecified chronic kidney disease: Secondary | ICD-10-CM | POA: Diagnosis not present

## 2022-11-05 DIAGNOSIS — N1832 Chronic kidney disease, stage 3b: Secondary | ICD-10-CM | POA: Diagnosis not present

## 2022-11-05 DIAGNOSIS — E1122 Type 2 diabetes mellitus with diabetic chronic kidney disease: Secondary | ICD-10-CM | POA: Diagnosis not present

## 2022-11-05 DIAGNOSIS — E7800 Pure hypercholesterolemia, unspecified: Secondary | ICD-10-CM | POA: Diagnosis not present

## 2022-11-05 DIAGNOSIS — I4891 Unspecified atrial fibrillation: Secondary | ICD-10-CM | POA: Diagnosis not present

## 2022-11-05 DIAGNOSIS — R42 Dizziness and giddiness: Secondary | ICD-10-CM | POA: Diagnosis not present

## 2022-11-05 DIAGNOSIS — I129 Hypertensive chronic kidney disease with stage 1 through stage 4 chronic kidney disease, or unspecified chronic kidney disease: Secondary | ICD-10-CM | POA: Diagnosis not present

## 2022-11-05 DIAGNOSIS — E1121 Type 2 diabetes mellitus with diabetic nephropathy: Secondary | ICD-10-CM | POA: Diagnosis not present

## 2022-11-05 LAB — LAB REPORT - SCANNED
A1c: 6.8
EGFR: 23

## 2022-11-27 ENCOUNTER — Telehealth: Payer: Self-pay

## 2022-11-27 DIAGNOSIS — I059 Rheumatic mitral valve disease, unspecified: Secondary | ICD-10-CM | POA: Diagnosis not present

## 2022-11-27 NOTE — Telephone Encounter (Signed)
-----   Message from Armanda Magic sent at 11/26/2022  3:01 PM EDT ----- BNP is elevated from PCP and apparently she was supposed to follow-up here but I do not see any future appointments and she canceled her appointment in June.  Please call her and get her in with an extender please ----- Message ----- From: Luellen Pucker, RN Sent: 11/26/2022   2:15 PM EDT To: Quintella Reichert, MD  BNP from [facility]

## 2022-11-27 NOTE — Telephone Encounter (Signed)
Called to discuss BNP results, no answer. Left detailed message per DPR asking patient or daughter to call our office to discuss.

## 2022-12-03 DIAGNOSIS — I361 Nonrheumatic tricuspid (valve) insufficiency: Secondary | ICD-10-CM | POA: Diagnosis not present

## 2022-12-03 DIAGNOSIS — I340 Nonrheumatic mitral (valve) insufficiency: Secondary | ICD-10-CM | POA: Diagnosis not present

## 2022-12-20 ENCOUNTER — Telehealth: Payer: Self-pay

## 2022-12-20 NOTE — Telephone Encounter (Signed)
----- 

## 2022-12-20 NOTE — Telephone Encounter (Signed)
Spoke with patient's daughter who stated they have moved patient up to [place], [place] for family support and medical care. Patient's dtr states she is very pleased with the follow up from all of patient's doctors and feels her mother is getting excellent care.

## 2023-01-14 DIAGNOSIS — R399 Unspecified symptoms and signs involving the genitourinary system: Secondary | ICD-10-CM | POA: Diagnosis not present

## 2023-01-14 DIAGNOSIS — I129 Hypertensive chronic kidney disease with stage 1 through stage 4 chronic kidney disease, or unspecified chronic kidney disease: Secondary | ICD-10-CM | POA: Diagnosis not present

## 2023-01-14 DIAGNOSIS — N1832 Chronic kidney disease, stage 3b: Secondary | ICD-10-CM | POA: Diagnosis not present

## 2023-01-14 DIAGNOSIS — Z952 Presence of prosthetic heart valve: Secondary | ICD-10-CM | POA: Diagnosis not present

## 2023-01-14 DIAGNOSIS — E1122 Type 2 diabetes mellitus with diabetic chronic kidney disease: Secondary | ICD-10-CM | POA: Diagnosis not present

## 2023-01-14 DIAGNOSIS — D649 Anemia, unspecified: Secondary | ICD-10-CM | POA: Diagnosis not present

## 2023-08-09 ENCOUNTER — Other Ambulatory Visit: Payer: Self-pay | Admitting: Cardiology
